# Patient Record
Sex: Female | Born: 1980
Health system: Southern US, Community
[De-identification: ages and names within clinical notes are randomized; demographics above are authoritative.]

## PROBLEM LIST (undated history)

## (undated) DIAGNOSIS — M542 Cervicalgia: Secondary | ICD-10-CM

## (undated) DIAGNOSIS — M791 Myalgia, unspecified site: Secondary | ICD-10-CM

## (undated) DIAGNOSIS — Z309 Encounter for contraceptive management, unspecified: Secondary | ICD-10-CM

## (undated) DIAGNOSIS — Z113 Encounter for screening for infections with a predominantly sexual mode of transmission: Secondary | ICD-10-CM

## (undated) DIAGNOSIS — E559 Vitamin D deficiency, unspecified: Secondary | ICD-10-CM

## (undated) DIAGNOSIS — R51 Headache: Secondary | ICD-10-CM

## (undated) DIAGNOSIS — F53 Postpartum depression: Secondary | ICD-10-CM

## (undated) DIAGNOSIS — E669 Obesity, unspecified: Secondary | ICD-10-CM

## (undated) DIAGNOSIS — M797 Fibromyalgia: Secondary | ICD-10-CM

## (undated) DIAGNOSIS — R768 Other specified abnormal immunological findings in serum: Secondary | ICD-10-CM

## (undated) DIAGNOSIS — M549 Dorsalgia, unspecified: Secondary | ICD-10-CM

## (undated) DIAGNOSIS — M069 Rheumatoid arthritis, unspecified: Secondary | ICD-10-CM

## (undated) DIAGNOSIS — O99345 Other mental disorders complicating the puerperium: Secondary | ICD-10-CM

## (undated) DIAGNOSIS — K589 Irritable bowel syndrome without diarrhea: Secondary | ICD-10-CM

## (undated) DIAGNOSIS — A599 Trichomoniasis, unspecified: Principal | ICD-10-CM

## (undated) DIAGNOSIS — K219 Gastro-esophageal reflux disease without esophagitis: Secondary | ICD-10-CM

## (undated) HISTORY — DX: Dorsalgia, unspecified: M54.9

## (undated) HISTORY — DX: Encounter for screening for infections with a predominantly sexual mode of transmission: Z11.3

## (undated) HISTORY — DX: Myalgia, unspecified site: M79.10

## (undated) HISTORY — DX: Postpartum depression: F53.0

## (undated) HISTORY — DX: Encounter for contraceptive management, unspecified: Z30.9

## (undated) HISTORY — DX: Fibromyalgia: M79.7

## (undated) HISTORY — DX: Obesity, unspecified: E66.9

## (undated) HISTORY — DX: Rheumatoid arthritis, unspecified: M06.9

## (undated) HISTORY — DX: Other specified abnormal immunological findings in serum: R76.8

## (undated) HISTORY — PX: DILATION AND CURETTAGE OF UTERUS: SHX78

## (undated) HISTORY — DX: Vitamin D deficiency, unspecified: E55.9

## (undated) HISTORY — DX: Cervicalgia: M54.2

## (undated) HISTORY — DX: Trichomoniasis, unspecified: A59.9

## (undated) HISTORY — DX: Other mental disorders complicating the puerperium: O99.345

---

## 2001-04-03 ENCOUNTER — Other Ambulatory Visit: Admission: RE | Admit: 2001-04-03 | Discharge: 2001-04-03 | Payer: Self-pay | Admitting: Obstetrics and Gynecology

## 2001-09-29 ENCOUNTER — Inpatient Hospital Stay (HOSPITAL_COMMUNITY): Admission: RE | Admit: 2001-09-29 | Discharge: 2001-10-02 | Payer: Self-pay | Admitting: Obstetrics and Gynecology

## 2003-09-10 ENCOUNTER — Ambulatory Visit (HOSPITAL_COMMUNITY): Admission: RE | Admit: 2003-09-10 | Discharge: 2003-09-10 | Payer: Self-pay | Admitting: Family Medicine

## 2003-10-19 ENCOUNTER — Ambulatory Visit (HOSPITAL_COMMUNITY): Admission: RE | Admit: 2003-10-19 | Discharge: 2003-10-19 | Payer: Self-pay | Admitting: Family Medicine

## 2003-11-22 ENCOUNTER — Ambulatory Visit (HOSPITAL_COMMUNITY): Admission: RE | Admit: 2003-11-22 | Discharge: 2003-11-22 | Payer: Self-pay | Admitting: Family Medicine

## 2003-12-01 ENCOUNTER — Encounter (HOSPITAL_COMMUNITY): Admission: RE | Admit: 2003-12-01 | Discharge: 2003-12-31 | Payer: Self-pay | Admitting: Family Medicine

## 2004-04-19 ENCOUNTER — Emergency Department (HOSPITAL_COMMUNITY): Admission: EM | Admit: 2004-04-19 | Discharge: 2004-04-19 | Payer: Self-pay | Admitting: Emergency Medicine

## 2004-04-30 ENCOUNTER — Emergency Department (HOSPITAL_COMMUNITY): Admission: EM | Admit: 2004-04-30 | Discharge: 2004-04-30 | Payer: Self-pay | Admitting: Emergency Medicine

## 2004-06-26 ENCOUNTER — Ambulatory Visit (HOSPITAL_COMMUNITY): Admission: RE | Admit: 2004-06-26 | Discharge: 2004-06-26 | Payer: Self-pay | Admitting: Family Medicine

## 2006-12-11 ENCOUNTER — Emergency Department (HOSPITAL_COMMUNITY): Admission: EM | Admit: 2006-12-11 | Discharge: 2006-12-11 | Payer: Self-pay | Admitting: Emergency Medicine

## 2006-12-26 ENCOUNTER — Encounter (HOSPITAL_COMMUNITY): Admission: RE | Admit: 2006-12-26 | Discharge: 2007-01-25 | Payer: Self-pay | Admitting: Family Medicine

## 2007-01-27 ENCOUNTER — Encounter (HOSPITAL_COMMUNITY): Admission: RE | Admit: 2007-01-27 | Discharge: 2007-02-19 | Payer: Self-pay | Admitting: Family Medicine

## 2007-02-21 ENCOUNTER — Encounter (HOSPITAL_COMMUNITY): Admission: RE | Admit: 2007-02-21 | Discharge: 2007-03-23 | Payer: Self-pay | Admitting: Family Medicine

## 2007-02-25 ENCOUNTER — Ambulatory Visit (HOSPITAL_COMMUNITY): Admission: RE | Admit: 2007-02-25 | Discharge: 2007-02-25 | Payer: Self-pay | Admitting: Family Medicine

## 2007-03-07 ENCOUNTER — Ambulatory Visit (HOSPITAL_COMMUNITY): Admission: RE | Admit: 2007-03-07 | Discharge: 2007-03-07 | Payer: Self-pay | Admitting: Family Medicine

## 2007-03-24 ENCOUNTER — Encounter (HOSPITAL_COMMUNITY): Admission: RE | Admit: 2007-03-24 | Discharge: 2007-04-23 | Payer: Self-pay | Admitting: Family Medicine

## 2007-11-26 ENCOUNTER — Other Ambulatory Visit: Admission: RE | Admit: 2007-11-26 | Discharge: 2007-11-26 | Payer: Self-pay | Admitting: Obstetrics and Gynecology

## 2008-11-26 ENCOUNTER — Other Ambulatory Visit: Admission: RE | Admit: 2008-11-26 | Discharge: 2008-11-26 | Payer: Self-pay | Admitting: Obstetrics and Gynecology

## 2009-12-06 ENCOUNTER — Other Ambulatory Visit: Admission: RE | Admit: 2009-12-06 | Discharge: 2009-12-06 | Payer: Self-pay | Admitting: Obstetrics and Gynecology

## 2010-07-07 NOTE — Op Note (Signed)
   NAME:  [name], [name] NO.:  [id number]   MEDICAL RECORD NO.:  [id number]                  PATIENT TYPE:   LOCATION:                                       FACILITY:   PHYSICIAN:  Tilda Burrow, M.D.              DATE OF BIRTH:   DATE OF PROCEDURE:  09/29/2001  DATE OF DISCHARGE:                                 OPERATIVE REPORT   PREOPERATIVE DIAGNOSIS:  Pregnancy [redacted] weeks gestation, repeat cesarean  section.   POSTOPERATIVE DIAGNOSIS:  Pregnancy [redacted] weeks gestation, repeat cesarean  section.   PROCEDURE:  Repeat low transverse cesarean section.   SURGEON:  Tilda Burrow, M.D.   ASSISTANT:  None.   ANESTHESIA:  Spinal.   COMPLICATIONS:  None.   ESTIMATED BLOOD LOSS:  Less than or equal to 400 cc.   FINDINGS:  A healthy female infant 7 pounds 9.7 ounces, a very thin lower  uterine segment.   INDICATIONS:  A 30 year old female scheduled for a repeat cesarean section  at [redacted] weeks gestation.   DETAILS OF PROCEDURE:  The patient was taken to the operating room and  prepped and draped in the usual fashion for lower abdominal surgery after  spinal anesthesia was introduced.  The old scar was trimmed and a  [name]-technique used for entry of the abdominal cavity with bladder  flap development on the very thinned out lower uterine segment.   A transverse nick was made in the lower uterine segment, extended laterally  using index finger traction and the fetal vertex rotated into the incision  and then delivered with fundal pressure and then manual guidance of the  vertex.  The patient had the cord clamped and cut after delivery of the  infant and the infant passed to the waiting physician.  The placenta was  then delivered after cord blood gases and samples obtained and documented  elsewhere.   The uterus was irrigated with antibiotic containing solution with a single  layer running locking closure of the uterus was performed using  #0 chromic  suture with good tissue approximation and hemostasis.  Bladder flap  reapproximation was with 2-0 chromic.  The pelvis was irrigated, inspected,  and confirmed as hemostatic.   Then, the anterior peritoneum closed using continuous running 2-0 chromic  followed by #0 Vicryl closure of the fascia and interrupted 2-0 plain  reapproximation of the subcu fatty tissue.  The patient tolerated the  procedure well and went to recovery room in excellent condition.                                               Tilda Burrow, M.D.    JVF/MEDQ  D:  12/07/2001  T:  12/07/2001  Job:  [id number]

## 2010-07-07 NOTE — Procedures (Signed)
[name], [name]              ACCOUNT NO.:  [id number]   MEDICAL RECORD NO.:  [id number]          PATIENT TYPE:  OUT   LOCATION:  RESP                          FACILITY:  [facility]   PHYSICIAN:  Edward L. Juanetta Gosling, M.D.DATE OF BIRTH:  1980-10-05   DATE OF PROCEDURE:  DATE OF DISCHARGE:  06/26/2004                              PULMONARY FUNCTION TEST   1.  Spirometry shows a moderate ventilatory defect with a fairly typical      restrictive shape of the flow volume curve.  2.  Lung volumes show reduction of total lung capacity of approximately the      same order of magnitude as the ventilatory defect previously noted.  3.  DLCO is mildly reduced.  4.  There is improvement with inhaled bronchodilator, but it does not reach      the level of significance.      ELH/MEDQ  D:  06/29/2004  T:  06/30/2004  Job:  [id number]
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
## 2010-07-07 NOTE — Discharge Summary (Signed)
   NAME:  [name], [name]                        ACCOUNT NO.:  [id number]   MEDICAL RECORD NO.:  [id number]                   PATIENT TYPE:  INP   LOCATION:  A420                                 FACILITY:  [facility]   PHYSICIAN:  Lazaro Arms, M.D.                DATE OF BIRTH:  1981-01-08   DATE OF ADMISSION:  09/29/2001  DATE OF DISCHARGE:  10/02/2001                                 DISCHARGE SUMMARY   DISCHARGE DIAGNOSES:  1. Status post a repeat low transverse cesarean section.  2. Unremarkable postoperative course.   PROCEDURE:  A repeat cesarean section.   Please refer to the transcribed history and physical and the operative note  for details of admission to the hospital.   HOSPITAL COURSE:  The patient was admitted postoperatively.  Intraoperative  course was unremarkable.  She was delivered of a healthy 7 pound 8 ounce  female infant.  She tolerated clear liquids and a regular diet, voided without  symptoms, ambulated without symptoms, had progression of bowel function with  flatus and bowel movement.  She tolerated transition from IV to oral pain  medicine.  Her incision was clean, dry, and intact.  Her hemoglobin and hematocrit on postoperative day #3 was 10.5 and 30.7.  She was discharged to home the morning of postoperative day #3 in good  stable condition.  Follow up in the office next Monday to have her staples  removed.  She was given Tylox and Motrin for pain and instructions and  precautions for return or contact our office prior to that time.                                               Lazaro Arms, M.D.    Loraine Maple  D:  10/02/2001  T:  10/06/2001  Job:  [id number]

## 2010-09-22 ENCOUNTER — Other Ambulatory Visit: Payer: Self-pay | Admitting: Obstetrics and Gynecology

## 2010-12-15 ENCOUNTER — Other Ambulatory Visit (HOSPITAL_COMMUNITY)
Admission: RE | Admit: 2010-12-15 | Discharge: 2010-12-15 | Disposition: A | Discharge status: Home / Self Care | Payer: BC Managed Care – PPO | Source: Ambulatory Visit | Attending: Obstetrics and Gynecology | Admitting: Obstetrics and Gynecology

## 2010-12-15 DIAGNOSIS — Z01419 Encounter for gynecological examination (general) (routine) without abnormal findings: Secondary | ICD-10-CM | POA: Insufficient documentation

## 2010-12-15 DIAGNOSIS — Z113 Encounter for screening for infections with a predominantly sexual mode of transmission: Secondary | ICD-10-CM | POA: Insufficient documentation

## 2011-07-12 LAB — OB RESULTS CONSOLE ANTIBODY SCREEN: Antibody Screen: NEGATIVE

## 2011-07-12 LAB — OB RESULTS CONSOLE RPR: RPR: NONREACTIVE

## 2011-07-12 LAB — OB RESULTS CONSOLE GC/CHLAMYDIA
Chlamydia: NEGATIVE
Gonorrhea: NEGATIVE

## 2011-07-12 LAB — OB RESULTS CONSOLE ABO/RH: RH Type: NEGATIVE

## 2011-07-12 LAB — OB RESULTS CONSOLE HIV ANTIBODY (ROUTINE TESTING): HIV: NONREACTIVE

## 2011-07-12 LAB — OB RESULTS CONSOLE HEPATITIS B SURFACE ANTIGEN: Hepatitis B Surface Ag: NEGATIVE

## 2011-07-12 LAB — OB RESULTS CONSOLE RUBELLA ANTIBODY, IGM: Rubella: IMMUNE

## 2011-10-30 ENCOUNTER — Emergency Department (HOSPITAL_COMMUNITY)
Admission: EM | Admit: 2011-10-30 | Discharge: 2011-10-30 | Disposition: A | Discharge status: Home / Self Care | Payer: BC Managed Care – PPO | Attending: Emergency Medicine | Admitting: Emergency Medicine

## 2011-10-30 ENCOUNTER — Emergency Department (HOSPITAL_COMMUNITY): Payer: BC Managed Care – PPO

## 2011-10-30 ENCOUNTER — Encounter (HOSPITAL_COMMUNITY): Payer: Self-pay | Admitting: *Deleted

## 2011-10-30 DIAGNOSIS — O99891 Other specified diseases and conditions complicating pregnancy: Secondary | ICD-10-CM | POA: Insufficient documentation

## 2011-10-30 DIAGNOSIS — M542 Cervicalgia: Secondary | ICD-10-CM

## 2011-10-30 DIAGNOSIS — M79609 Pain in unspecified limb: Secondary | ICD-10-CM | POA: Insufficient documentation

## 2011-10-30 DIAGNOSIS — M79671 Pain in right foot: Secondary | ICD-10-CM

## 2011-10-30 DIAGNOSIS — Z3490 Encounter for supervision of normal pregnancy, unspecified, unspecified trimester: Secondary | ICD-10-CM

## 2011-10-30 NOTE — Progress Notes (Signed)
Toco reapplied after return from radiology.  Doppler applied again and FHR 155-160.

## 2011-10-30 NOTE — Progress Notes (Signed)
Dr. Revonda Humphrey make aware of no further obstetrical needs.  Patient given instructions to return to [facility]U if sharp abdominal pain, vaginal bleeding or leaking. She will call [facility] tomorrow to discuss if follow up before 9/30 is needed.   Patient continues to deny any sharp abdominal pain, continues to have mild RLQ pressure. No vaginal bleeding or leaking noted.
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
## 2011-10-30 NOTE — Progress Notes (Signed)
OB RR RN at bedside. Pt in no apparent distress. Pt reports no abd pain, no contractions, no vaginal bleeding or leaking. Pt EDC 03/07/12 and is seen at [facility] in [place]. Pt reports no complications with pregnancy and has had 2 csections with previous children. Pt only complaint in neck stiffness.

## 2011-10-30 NOTE — ED Notes (Signed)
See trauma narrator 
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
## 2011-10-30 NOTE — ED Notes (Signed)
Pt to xray

## 2011-10-30 NOTE — Progress Notes (Signed)
Spoke with Dr. Marice Potter about patient and history given. From obstetrical standpoint patient can be discharged home once medically cleared.
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
## 2011-10-30 NOTE — Progress Notes (Signed)
Report given to Maxine Glenn for [facility] RR RN care
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
## 2012-02-22 ENCOUNTER — Encounter (HOSPITAL_COMMUNITY): Payer: Self-pay | Admitting: Pharmacist

## 2012-02-23 ENCOUNTER — Encounter (HOSPITAL_COMMUNITY): Payer: Self-pay | Admitting: Obstetrics and Gynecology
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
## 2012-02-25 ENCOUNTER — Encounter (HOSPITAL_COMMUNITY): Payer: Self-pay

## 2012-02-26 ENCOUNTER — Encounter (HOSPITAL_COMMUNITY)
Admission: RE | Admit: 2012-02-26 | Discharge: 2012-02-26 | Disposition: A | Discharge status: Home / Self Care | Payer: 59 | Source: Ambulatory Visit | Attending: Obstetrics and Gynecology | Admitting: Obstetrics and Gynecology

## 2012-02-26 ENCOUNTER — Encounter (HOSPITAL_COMMUNITY): Payer: Self-pay

## 2012-02-26 HISTORY — DX: Gastro-esophageal reflux disease without esophagitis: K21.9

## 2012-02-26 HISTORY — DX: Headache: R51

## 2012-02-26 LAB — CBC
HCT: 31.1 % — ABNORMAL LOW (ref 36.0–46.0)
Hemoglobin: 10.3 g/dL — ABNORMAL LOW (ref 12.0–15.0)
MCH: 28.9 pg (ref 26.0–34.0)
MCHC: 33.1 g/dL (ref 30.0–36.0)
MCV: 87.1 fL (ref 78.0–100.0)
Platelets: 218 10*3/uL (ref 150–400)
RBC: 3.57 MIL/uL — ABNORMAL LOW (ref 3.87–5.11)
RDW: 13.8 % (ref 11.5–15.5)
WBC: 6.7 10*3/uL (ref 4.0–10.5)

## 2012-02-26 LAB — TYPE AND SCREEN
ABO/RH(D): O NEG
Antibody Screen: POSITIVE
DAT, IgG: NEGATIVE

## 2012-02-26 LAB — SURGICAL PCR SCREEN
MRSA, PCR: NEGATIVE
Staphylococcus aureus: POSITIVE — AB

## 2012-02-26 LAB — RPR: RPR Ser Ql: NONREACTIVE

## 2012-02-26 NOTE — Patient Instructions (Addendum)
   Your procedure is scheduled XB:[id number] January 10th  Enter through the Main Entrance of [facility] at:11:30am Pick up the phone at the desk and dial [phone] and inform us of your arrival.  Please call this number if you have any problems the morning of surgery: [phone]  Remember: Do not eat food after midnight on Thursday You may have clear liquids until 9am on Friday then nothing   Do not wear jewelry, make-up, or FINGER nail polish No metal in your hair or on your body. Do not wear lotions, powders, perfumes. You may wear deodorant.  Please use your CHG wash as directed prior to surgery.  Do not shave anywhere for at least 12 hours prior to first CHG shower.  Do not bring valuables to the hospital.   Leave suitcase in the car. After Surgery it may be brought to your room. For patients being admitted to the hospital, checkout time is 11:00am the day of discharge.

## 2012-02-29 ENCOUNTER — Encounter (HOSPITAL_COMMUNITY): Payer: Self-pay | Admitting: *Deleted

## 2012-02-29 ENCOUNTER — Inpatient Hospital Stay (HOSPITAL_COMMUNITY)
Admission: RE | Admit: 2012-02-29 | Discharge: 2012-03-02 | DRG: 766 | Disposition: A | Discharge status: Home / Self Care | Payer: 59 | Source: Ambulatory Visit | Attending: Obstetrics and Gynecology | Admitting: Obstetrics and Gynecology

## 2012-02-29 ENCOUNTER — Encounter (HOSPITAL_COMMUNITY)
Admission: RE | Disposition: A | Discharge status: Home / Self Care | Payer: Self-pay | Source: Ambulatory Visit | Attending: Obstetrics and Gynecology

## 2012-02-29 ENCOUNTER — Encounter (HOSPITAL_COMMUNITY): Payer: Self-pay | Admitting: Anesthesiology

## 2012-02-29 ENCOUNTER — Inpatient Hospital Stay (HOSPITAL_COMMUNITY): Payer: 59 | Admitting: Anesthesiology

## 2012-02-29 DIAGNOSIS — Z01812 Encounter for preprocedural laboratory examination: Secondary | ICD-10-CM

## 2012-02-29 DIAGNOSIS — Z01818 Encounter for other preprocedural examination: Secondary | ICD-10-CM

## 2012-02-29 DIAGNOSIS — O34219 Maternal care for unspecified type scar from previous cesarean delivery: Secondary | ICD-10-CM

## 2012-02-29 HISTORY — DX: Irritable bowel syndrome, unspecified: K58.9

## 2012-02-29 LAB — PREPARE RBC (CROSSMATCH)

## 2012-02-29 SURGERY — Surgical Case
Anesthesia: Spinal | Site: Abdomen | Wound class: Clean Contaminated

## 2012-02-29 MED ORDER — OXYCODONE-ACETAMINOPHEN 5-325 MG PO TABS
1.0000 | ORAL_TABLET | ORAL | Status: DC | PRN
  Administered 2012-02-29 – 2012-03-02 (×6): 1 via ORAL
  Filled 2012-02-29 (×6): qty 1

## 2012-02-29 MED ORDER — ZOLPIDEM TARTRATE 5 MG PO TABS: 5.0000 mg | ORAL_TABLET | Freq: Every evening | ORAL | Status: DC | PRN

## 2012-02-29 MED ORDER — PROMETHAZINE HCL 25 MG/ML IJ SOLN: 6.2500 mg | INTRAMUSCULAR | Status: DC | PRN

## 2012-02-29 MED ORDER — NALBUPHINE SYRINGE 5 MG/0.5 ML
INJECTION | INTRAMUSCULAR | Status: AC
  Administered 2012-02-29: 10 mg
  Filled 2012-02-29: qty 1

## 2012-02-29 MED ORDER — SCOPOLAMINE 1 MG/3DAYS TD PT72
MEDICATED_PATCH | TRANSDERMAL | Status: AC
  Filled 2012-02-29: qty 1

## 2012-02-29 MED ORDER — FENTANYL CITRATE 0.05 MG/ML IJ SOLN
25.0000 ug | INTRAMUSCULAR | Status: DC | PRN
  Administered 2012-02-29: 50 ug via INTRAVENOUS

## 2012-02-29 MED ORDER — IBUPROFEN 600 MG PO TABS
600.0000 mg | ORAL_TABLET | Freq: Four times a day (QID) | ORAL | Status: DC
  Administered 2012-02-29 – 2012-03-02 (×7): 600 mg via ORAL
  Filled 2012-02-29 (×7): qty 1

## 2012-02-29 MED ORDER — CEFAZOLIN SODIUM-DEXTROSE 2-3 GM-% IV SOLR
INTRAVENOUS | Status: AC
  Filled 2012-02-29: qty 50

## 2012-02-29 MED ORDER — MENTHOL 3 MG MT LOZG: 1.0000 | LOZENGE | OROMUCOSAL | Status: DC | PRN

## 2012-02-29 MED ORDER — SENNOSIDES-DOCUSATE SODIUM 8.6-50 MG PO TABS
2.0000 | ORAL_TABLET | Freq: Every day | ORAL | Status: DC
  Administered 2012-02-29: 2 via ORAL

## 2012-02-29 MED ORDER — BUPIVACAINE IN DEXTROSE 0.75-8.25 % IT SOLN
INTRATHECAL | Status: DC | PRN
  Administered 2012-02-29: 12 mg via INTRATHECAL

## 2012-02-29 MED ORDER — METOCLOPRAMIDE HCL 5 MG/ML IJ SOLN: 10.0000 mg | Freq: Three times a day (TID) | INTRAMUSCULAR | Status: DC | PRN

## 2012-02-29 MED ORDER — SIMETHICONE 80 MG PO CHEW
80.0000 mg | CHEWABLE_TABLET | Freq: Three times a day (TID) | ORAL | Status: DC
  Administered 2012-02-29 – 2012-03-02 (×7): 80 mg via ORAL

## 2012-02-29 MED ORDER — LACTATED RINGERS IV SOLN
INTRAVENOUS | Status: DC
  Administered 2012-02-29 – 2012-03-01 (×2): via INTRAVENOUS

## 2012-02-29 MED ORDER — OXYTOCIN 10 UNIT/ML IJ SOLN
INTRAMUSCULAR | Status: AC
  Filled 2012-02-29: qty 4

## 2012-02-29 MED ORDER — ONDANSETRON HCL 4 MG/2ML IJ SOLN
INTRAMUSCULAR | Status: AC
  Filled 2012-02-29: qty 2

## 2012-02-29 MED ORDER — MEPERIDINE HCL 25 MG/ML IJ SOLN: 6.2500 mg | INTRAMUSCULAR | Status: DC | PRN

## 2012-02-29 MED ORDER — KETOROLAC TROMETHAMINE 30 MG/ML IJ SOLN: 30.0000 mg | Freq: Four times a day (QID) | INTRAMUSCULAR | Status: DC | PRN

## 2012-02-29 MED ORDER — PRENATAL MULTIVITAMIN CH
1.0000 | ORAL_TABLET | Freq: Every day | ORAL | Status: DC
  Administered 2012-03-01 – 2012-03-02 (×2): 1 via ORAL
  Filled 2012-02-29 (×2): qty 1

## 2012-02-29 MED ORDER — SCOPOLAMINE 1 MG/3DAYS TD PT72
1.0000 | MEDICATED_PATCH | Freq: Once | TRANSDERMAL | Status: DC
  Administered 2012-02-29: 1.5 mg via TRANSDERMAL

## 2012-02-29 MED ORDER — FENTANYL CITRATE 0.05 MG/ML IJ SOLN
INTRAMUSCULAR | Status: AC
  Filled 2012-02-29: qty 2

## 2012-02-29 MED ORDER — ONDANSETRON HCL 4 MG/2ML IJ SOLN: 4.0000 mg | Freq: Three times a day (TID) | INTRAMUSCULAR | Status: DC | PRN

## 2012-02-29 MED ORDER — ONDANSETRON HCL 4 MG PO TABS: 4.0000 mg | ORAL_TABLET | ORAL | Status: DC | PRN

## 2012-02-29 MED ORDER — ONDANSETRON HCL 4 MG/2ML IJ SOLN: 4.0000 mg | INTRAMUSCULAR | Status: DC | PRN

## 2012-02-29 MED ORDER — DIPHENHYDRAMINE HCL 25 MG PO CAPS: 25.0000 mg | ORAL_CAPSULE | Freq: Four times a day (QID) | ORAL | Status: DC | PRN

## 2012-02-29 MED ORDER — LACTATED RINGERS IV SOLN
INTRAVENOUS | Status: DC | PRN
  Administered 2012-02-29 (×3): via INTRAVENOUS

## 2012-02-29 MED ORDER — SCOPOLAMINE 1 MG/3DAYS TD PT72: 1.0000 | MEDICATED_PATCH | Freq: Once | TRANSDERMAL | Status: DC

## 2012-02-29 MED ORDER — FENTANYL CITRATE 0.05 MG/ML IJ SOLN
INTRAMUSCULAR | Status: AC
  Administered 2012-02-29: 50 ug via INTRAVENOUS
  Filled 2012-02-29: qty 2

## 2012-02-29 MED ORDER — SODIUM CHLORIDE 0.9 % IJ SOLN: 3.0000 mL | INTRAMUSCULAR | Status: DC | PRN

## 2012-02-29 MED ORDER — ONDANSETRON HCL 4 MG/2ML IJ SOLN
INTRAMUSCULAR | Status: DC | PRN
  Administered 2012-02-29: 4 mg via INTRAVENOUS

## 2012-02-29 MED ORDER — MIDAZOLAM HCL 2 MG/2ML IJ SOLN: 0.5000 mg | Freq: Once | INTRAMUSCULAR | Status: DC | PRN

## 2012-02-29 MED ORDER — PHENYLEPHRINE 40 MCG/ML (10ML) SYRINGE FOR IV PUSH (FOR BLOOD PRESSURE SUPPORT)
PREFILLED_SYRINGE | INTRAVENOUS | Status: AC
  Filled 2012-02-29: qty 10

## 2012-02-29 MED ORDER — PHENYLEPHRINE 40 MCG/ML (10ML) SYRINGE FOR IV PUSH (FOR BLOOD PRESSURE SUPPORT)
PREFILLED_SYRINGE | INTRAVENOUS | Status: AC
Start: 2012-02-29 — End: 2012-02-29
  Filled 2012-02-29: qty 5

## 2012-02-29 MED ORDER — EPHEDRINE SULFATE 50 MG/ML IJ SOLN
INTRAMUSCULAR | Status: DC | PRN
  Administered 2012-02-29 (×4): 5 mg via INTRAVENOUS

## 2012-02-29 MED ORDER — MORPHINE SULFATE (PF) 0.5 MG/ML IJ SOLN
INTRAMUSCULAR | Status: DC | PRN
  Administered 2012-02-29: .1 mg via INTRATHECAL

## 2012-02-29 MED ORDER — NALOXONE HCL 0.4 MG/ML IJ SOLN: 0.4000 mg | INTRAMUSCULAR | Status: DC | PRN

## 2012-02-29 MED ORDER — NALBUPHINE HCL 10 MG/ML IJ SOLN: 5.0000 mg | INTRAMUSCULAR | Status: DC | PRN

## 2012-02-29 MED ORDER — OXYTOCIN 40 UNITS IN LACTATED RINGERS INFUSION - SIMPLE MED: 62.5000 mL/h | INTRAVENOUS | Status: DC

## 2012-02-29 MED ORDER — LACTATED RINGERS IV SOLN
Freq: Once | INTRAVENOUS | Status: AC
  Administered 2012-02-29: 1000 mL/h via INTRAVENOUS

## 2012-02-29 MED ORDER — OXYCODONE-ACETAMINOPHEN 5-325 MG PO TABS: 1.0000 | ORAL_TABLET | ORAL | Status: DC | PRN

## 2012-02-29 MED ORDER — DIPHENHYDRAMINE HCL 50 MG/ML IJ SOLN: 25.0000 mg | INTRAMUSCULAR | Status: DC | PRN

## 2012-02-29 MED ORDER — CEFAZOLIN SODIUM-DEXTROSE 2-3 GM-% IV SOLR
2.0000 g | Freq: Once | INTRAVENOUS | Status: AC
  Administered 2012-02-29: 2 g via INTRAVENOUS

## 2012-02-29 MED ORDER — NALOXONE HCL 1 MG/ML IJ SOLN: 1.0000 ug/kg/h | INTRAMUSCULAR | Status: DC | PRN

## 2012-02-29 MED ORDER — KETOROLAC TROMETHAMINE 30 MG/ML IJ SOLN
INTRAMUSCULAR | Status: AC
  Administered 2012-02-29: 30 mg via INTRAVENOUS
  Filled 2012-02-29: qty 1

## 2012-02-29 MED ORDER — FENTANYL CITRATE 0.05 MG/ML IJ SOLN
INTRAMUSCULAR | Status: DC | PRN
  Administered 2012-02-29: 25 ug via INTRATHECAL

## 2012-02-29 MED ORDER — DIBUCAINE 1 % RE OINT: 1.0000 "application " | TOPICAL_OINTMENT | RECTAL | Status: DC | PRN

## 2012-02-29 MED ORDER — TETANUS-DIPHTH-ACELL PERTUSSIS 5-2.5-18.5 LF-MCG/0.5 IM SUSP: 0.5000 mL | Freq: Once | INTRAMUSCULAR | Status: DC

## 2012-02-29 MED ORDER — WITCH HAZEL-GLYCERIN EX PADS: 1.0000 "application " | MEDICATED_PAD | CUTANEOUS | Status: DC | PRN

## 2012-02-29 MED ORDER — LANOLIN HYDROUS EX OINT: 1.0000 "application " | TOPICAL_OINTMENT | CUTANEOUS | Status: DC | PRN

## 2012-02-29 MED ORDER — DIPHENHYDRAMINE HCL 50 MG/ML IJ SOLN: 12.5000 mg | INTRAMUSCULAR | Status: DC | PRN

## 2012-02-29 MED ORDER — EPHEDRINE 5 MG/ML INJ
INTRAVENOUS | Status: AC
  Filled 2012-02-29: qty 10

## 2012-02-29 MED ORDER — MORPHINE SULFATE 0.5 MG/ML IJ SOLN
INTRAMUSCULAR | Status: AC
  Filled 2012-02-29: qty 10

## 2012-02-29 MED ORDER — PHENYLEPHRINE HCL 10 MG/ML IJ SOLN
INTRAMUSCULAR | Status: DC | PRN
  Administered 2012-02-29: 80 ug via INTRAVENOUS
  Administered 2012-02-29 (×2): 40 ug via INTRAVENOUS
  Administered 2012-02-29 (×3): 80 ug via INTRAVENOUS
  Administered 2012-02-29: 40 ug via INTRAVENOUS
  Administered 2012-02-29 (×2): 80 ug via INTRAVENOUS

## 2012-02-29 MED ORDER — KETOROLAC TROMETHAMINE 30 MG/ML IJ SOLN
30.0000 mg | Freq: Four times a day (QID) | INTRAMUSCULAR | Status: DC | PRN
  Administered 2012-02-29: 30 mg via INTRAVENOUS

## 2012-02-29 MED ORDER — DIPHENHYDRAMINE HCL 25 MG PO CAPS: 25.0000 mg | ORAL_CAPSULE | ORAL | Status: DC | PRN

## 2012-02-29 MED ORDER — ACETAMINOPHEN 10 MG/ML IV SOLN: 1000.0000 mg | Freq: Four times a day (QID) | INTRAVENOUS | Status: AC | PRN

## 2012-02-29 MED ORDER — OXYTOCIN 10 UNIT/ML IJ SOLN
40.0000 [IU] | INTRAVENOUS | Status: DC | PRN
  Administered 2012-02-29: 40 [IU] via INTRAVENOUS

## 2012-02-29 MED ORDER — SIMETHICONE 80 MG PO CHEW: 80.0000 mg | CHEWABLE_TABLET | ORAL | Status: DC | PRN

## 2012-02-29 SURGICAL SUPPLY — 36 items
APL SKNCLS STERI-STRIP NONHPOA (GAUZE/BANDAGES/DRESSINGS) ×1
BENZOIN TINCTURE PRP APPL 2/3 (GAUZE/BANDAGES/DRESSINGS) ×1 IMPLANT
CLOTH BEACON ORANGE TIMEOUT ST (SAFETY) ×2 IMPLANT
DRAPE LG THREE QUARTER DISP (DRAPES) ×2 IMPLANT
DRSG OPSITE POSTOP 4X10 (GAUZE/BANDAGES/DRESSINGS) ×2 IMPLANT
DURAPREP 26ML APPLICATOR (WOUND CARE) ×2 IMPLANT
ELECT REM PT RETURN 9FT ADLT (ELECTROSURGICAL) ×2
ELECTRODE REM PT RTRN 9FT ADLT (ELECTROSURGICAL) ×1 IMPLANT
EXTRACTOR VACUUM KIWI (MISCELLANEOUS) IMPLANT
GLOVE BIO SURGEON ST LM GN SZ9 (GLOVE) ×2 IMPLANT
GLOVE BIOGEL PI IND STRL 9 (GLOVE) ×2 IMPLANT
GLOVE BIOGEL PI INDICATOR 9 (GLOVE) ×2
GOWN PREVENTION PLUS LG XLONG (DISPOSABLE) ×2 IMPLANT
GOWN PREVENTION PLUS XLARGE (GOWN DISPOSABLE) ×2 IMPLANT
GOWN STRL REIN 3XL LVL4 (GOWN DISPOSABLE) ×2 IMPLANT
NDL HYPO 25X5/8 SAFETYGLIDE (NEEDLE) IMPLANT
NEEDLE HYPO 25X5/8 SAFETYGLIDE (NEEDLE) IMPLANT
NS IRRIG 1000ML POUR BTL (IV SOLUTION) ×2 IMPLANT
PACK C SECTION WH (CUSTOM PROCEDURE TRAY) ×2 IMPLANT
PAD OB MATERNITY 4.3X12.25 (PERSONAL CARE ITEMS) ×2 IMPLANT
RETRACTOR WND ALEXIS 25 LRG (MISCELLANEOUS) IMPLANT
RTRCTR C-SECT PINK 25CM LRG (MISCELLANEOUS) IMPLANT
RTRCTR WOUND ALEXIS 25CM LRG (MISCELLANEOUS)
SLEEVE SCD COMPRESS KNEE MED (MISCELLANEOUS) IMPLANT
STRIP CLOSURE SKIN 1/2X4 (GAUZE/BANDAGES/DRESSINGS) IMPLANT
STRIP CLOSURE SKIN 1/4X4 (GAUZE/BANDAGES/DRESSINGS) ×1 IMPLANT
SUT CHROMIC 0 CTX 36 (SUTURE) ×4 IMPLANT
SUT VIC AB 0 CT1 27 (SUTURE) ×2
SUT VIC AB 0 CT1 27XBRD ANBCTR (SUTURE) ×1 IMPLANT
SUT VIC AB 2-0 CT1 27 (SUTURE) ×4
SUT VIC AB 2-0 CT1 TAPERPNT 27 (SUTURE) ×2 IMPLANT
SUT VIC AB 4-0 KS 27 (SUTURE) ×2 IMPLANT
SYR BULB IRRIGATION 50ML (SYRINGE) IMPLANT
TOWEL OR 17X24 6PK STRL BLUE (TOWEL DISPOSABLE) ×2 IMPLANT
TRAY FOLEY CATH 14FR (SET/KITS/TRAYS/PACK) ×2 IMPLANT
WATER STERILE IRR 1000ML POUR (IV SOLUTION) ×1 IMPLANT

## 2012-02-29 NOTE — Anesthesia Postprocedure Evaluation (Signed)
Anesthesia Post Note  Patient: [name]  Procedure(s) Performed: Procedure(s) (LRB): CESAREAN SECTION (N/A)  Anesthesia type: Spinal  Patient location: PACU  Post pain: Pain level controlled  Post assessment: Post-op Vital signs reviewed  Last Vitals:  Filed Vitals:   02/29/12 1545  BP: 93/47  Pulse: 81  Temp:   Resp: 20    Post vital signs: Reviewed  Level of consciousness: awake  Complications: No apparent anesthesia complications

## 2012-02-29 NOTE — Anesthesia Preprocedure Evaluation (Signed)
Anesthesia Evaluation  Patient identified by MRN, date of birth, ID band Patient awake    Reviewed: Allergy & Precautions, H&P , NPO status , Patient's Chart, lab work & pertinent test results  Airway Mallampati: II      Dental No notable dental hx.    Pulmonary neg pulmonary ROS,  breath sounds clear to auscultation  Pulmonary exam normal       Cardiovascular Exercise Tolerance: Good negative cardio ROS  Rhythm:regular Rate:Normal     Neuro/Psych  Headaches, negative neurological ROS  negative psych ROS   GI/Hepatic negative GI ROS, Neg liver ROS, GERD-  ,  Endo/Other  negative endocrine ROSMorbid obesity  Renal/GU negative Renal ROS  negative genitourinary   Musculoskeletal   Abdominal Normal abdominal exam  (+)   Peds  Hematology negative hematology ROS (+)   Anesthesia Other Findings  IBS (irritable bowel syndrome)     GERD (gastroesophageal reflux disease)   with pregnancy    Headache              Reproductive/Obstetrics (+) Pregnancy                           Anesthesia Physical Anesthesia Plan  ASA: III  Anesthesia Plan: Spinal   Post-op Pain Management:    Induction:   Airway Management Planned:   Additional Equipment:   Intra-op Plan:   Post-operative Plan:   Informed Consent: I have reviewed the patients History and Physical, chart, labs and discussed the procedure including the risks, benefits and alternatives for the proposed anesthesia with the patient or authorized representative who has indicated his/her understanding and acceptance.     Plan Discussed with: Anesthesiologist, CRNA and Surgeon  Anesthesia Plan Comments:         Anesthesia Quick Evaluation

## 2012-02-29 NOTE — Brief Op Note (Signed)
02/29/2012  3:20 PM  PATIENT:  [name]  32 y.o. female  PRE-OPERATIVE DIAGNOSIS:  previous c/s x 2 , not for trial of labor Pregnancy 39 weeks   POST-OPERATIVE DIAGNOSIS:  Previous Cesarean Section  PROCEDURE:  Procedure(s) (LRB) with comments: CESAREAN SECTION (N/A) repeat Low Transverse  SURGEON:  Surgeon(s) and Role:    * Tilda Burrow, MD - Primary  PHYSICIAN ASSISTANT: Thad Ranger, MD   ASSISTANTS: none   ANESTHESIA:   spinal  EBL:  Total I/O In: 2600 [I.V.:2600] Out: 600 [Urine:100; Blood:500]  BLOOD ADMINISTERED:none  DRAINS: none   LOCAL MEDICATIONS USED:  NONE  SPECIMEN:  Source of Specimen:  placenta, to L  DISPOSITION OF SPECIMEN:  Labor and Delivery  COUNTS:  YES  TOURNIQUET:  * No tourniquets in log *  DICTATION: .Dragon Dictation Patient was taken to the operating room prepped and draped for lower surgery timeout conducted and procedure confirmed by surgical team. [name] 2 g administered intravenously. Abdomen was taped up so that the [name] incision site could be accessed. Incision was repeated without removal of the old scar. The fascia was easily dissected off the rectus muscles and peritoneal cavity entered in the midline easily. There was minimal adhesions present 2 tiny omental adhesions to the old bladder flap incision. Transverse uterine incision was made the fetal vertex rotated into the incision from its occiput posterior presentation and delivered easily with cord clamp placed and the female infant passed to the waiting pediatrician Dr. [name]. Placenta delivered easily from its posterior fundal location and membrane remnants were removed from the lower uterine segment. 2 layer closure of the uterus was performed with 0 chromic suture, the first layer running locking the second layer continuous running. Abdomen was irrigated, anterior peritoneum closed with running 2-0 chromic, the fascia closed with running 0 Vicryl beginning at each end  and sewing to the middle, then subcutaneous skin was then mobilized to allow for good tissue approximation and 3 interrupted horizontal mattress sutures placed to allow for tissue reapproximation then subcuticular 4-0 Vicryl closure of skin with a [name] needle performed. Sponge and needle counts correct EBL 500cc. PLAN OF CARE: Admit to inpatient   PATIENT DISPOSITION:  PACU - hemodynamically stable.   Delay start of Pharmacological VTE agent (>24hrs) due to surgical blood loss or risk of bleeding: yes

## 2012-02-29 NOTE — Interval H&P Note (Signed)
History and Physical Interval Note:  02/29/2012 1:20 PM  [name]  has presented today for surgery, with the diagnosis of previous c/s  The various methods of treatment have been discussed with the patient and family. After consideration of risks, benefits and other options for treatment, the patient has consented to  Procedure(s) (LRB) with comments: CESAREAN SECTION (N/A) as a surgical intervention .  The patient's history has been reviewed, patient examined, no change in status, stable for surgery.  I have reviewed the patient's chart and labs.  Questions were answered to the patient's satisfaction.     Tilda Burrow

## 2012-02-29 NOTE — Op Note (Signed)
See operative note details included in the brief operative note 

## 2012-02-29 NOTE — Transfer of Care (Signed)
Immediate Anesthesia Transfer of Care Note  Patient: [name]  Procedure(s) Performed: Procedure(s) (LRB) with comments: CESAREAN SECTION (N/A)  Patient Location: PACU  Anesthesia Type:General  Level of Consciousness: awake  Airway & Oxygen Therapy: Patient Spontanous Breathing  Post-op Assessment: Report given to PACU RN  Post vital signs: stable  Filed Vitals:   02/29/12 1150  BP: 102/54  Pulse: 104  Temp: 37 C  Resp: 20    Complications: No apparent anesthesia complications

## 2012-03-01 LAB — CBC
HCT: 25.2 % — ABNORMAL LOW (ref 36.0–46.0)
Hemoglobin: 8.4 g/dL — ABNORMAL LOW (ref 12.0–15.0)
MCH: 29.3 pg (ref 26.0–34.0)
MCHC: 33.3 g/dL (ref 30.0–36.0)
MCV: 87.8 fL (ref 78.0–100.0)
Platelets: 195 10*3/uL (ref 150–400)
RBC: 2.87 MIL/uL — ABNORMAL LOW (ref 3.87–5.11)
RDW: 14.1 % (ref 11.5–15.5)
WBC: 9 10*3/uL (ref 4.0–10.5)

## 2012-03-01 MED ORDER — LACTATED RINGERS IV BOLUS (SEPSIS)
500.0000 mL | Freq: Once | INTRAVENOUS | Status: AC
  Administered 2012-03-01: 500 mL via INTRAVENOUS

## 2012-03-01 NOTE — Progress Notes (Signed)
Subjective: Postpartum Day 1: Cesarean Delivery Patient reports incisional pain and tolerating PO.  No complaints a bit of itching  Objective: Vital signs in last 24 hours: Temp:  [97.3 F (36.3 C)-98.6 F (37 C)] 98 F (36.7 C) (01/11 0606) Pulse Rate:  [66-104] 83  (01/11 0606) Resp:  [16-20] 20  (01/11 0606) BP: (86-116)/(46-82) 111/82 mmHg (01/11 0606) SpO2:  [95 %-100 %] 96 % (01/11 0606) Weight:  [104.327 kg (230 lb)] 104.327 kg (230 lb) (01/10 1722)  Physical Exam:  General: alert, cooperative and no distress Lochia: appropriate Uterine Fundus: firm Incision: dressing dry DVT Evaluation: No evidence of DVT seen on physical exam.   Basename 03/01/12 0555  HGB 8.4*  HCT 25.2*    Assessment/Plan: Status post Cesarean section. Doing well postoperatively.  Continue current care.  Derry Kassel H 03/01/2012, 7:10 AM

## 2012-03-01 NOTE — Addendum Note (Signed)
Addendum  created 03/01/12 0952 by Yaviel Kloster S Maiana Hennigan, CRNA   Modules edited:Notes Section    

## 2012-03-01 NOTE — Anesthesia Postprocedure Evaluation (Signed)
  Anesthesia Post-op Note   Anesthesia Post-op Note  Patient: [name]  Procedure(s) Performed: Procedure(s) (LRB) with comments: CESAREAN SECTION (N/A)  Patient Location: PACU and Mother/Baby  Anesthesia Type:Spinal  Level of Consciousness: awake, alert  and oriented  Airway and Oxygen Therapy: Patient Spontanous Breathing  Post-op Pain: mild  Post-op Assessment: Patient's Cardiovascular Status Stable, Respiratory Function Stable, No signs of Nausea or vomiting and Pain level controlled  Post-op Vital Signs: stable  Complications: No apparent anesthesia complications

## 2012-03-02 LAB — HEMOGLOBIN AND HEMATOCRIT, BLOOD
HCT: 26.9 % — ABNORMAL LOW (ref 36.0–46.0)
Hemoglobin: 9 g/dL — ABNORMAL LOW (ref 12.0–15.0)

## 2012-03-02 LAB — BIRTH TISSUE RECOVERY COLLECTION (PLACENTA DONATION)

## 2012-03-02 MED ORDER — FERROUS SULFATE 325 (65 FE) MG PO TABS: 325.0000 mg | ORAL_TABLET | Freq: Two times a day (BID) | ORAL | Status: DC

## 2012-03-02 MED ORDER — PRENATAL MULTIVITAMIN CH: 1.0000 | ORAL_TABLET | Freq: Every day | ORAL | Status: DC

## 2012-03-02 MED ORDER — OXYCODONE-ACETAMINOPHEN 5-325 MG PO TABS: 1.0000 | ORAL_TABLET | Freq: Four times a day (QID) | ORAL | Status: DC | PRN

## 2012-03-02 MED ORDER — IBUPROFEN 600 MG PO TABS: 600.0000 mg | ORAL_TABLET | Freq: Four times a day (QID) | ORAL | Status: DC | PRN

## 2012-03-02 NOTE — Discharge Summary (Signed)
Obstetric Discharge Summary Reason for Admission: cesarean section, scheduled repeat Prenatal Procedures: none Intrapartum Procedures: cesarean: low cervical, transverse Postpartum Procedures: none Complications-Operative and Postpartum: none Eating, drinking, voiding, ambulating well.  +flatus.  Lochia and pain wnl.  No complaints.  Desires early d/c  Hemoglobin  Date Value Range Status  03/02/2012 9.0* 12.0 - 15.0 g/dL Final     HCT  Date Value Range Status  03/02/2012 26.9* 36.0 - 46.0 % Final    Physical Exam:  General: alert, cooperative and no distress Lochia: appropriate Uterine Fundus: firm Incision: healing well, no significant drainage, no dehiscence, no significant erythema. Honeycomb dressing intact. DVT Evaluation: No evidence of DVT seen on physical exam. Negative Homan's sign. No cords or calf tenderness. 2+BLE edema  Discharge Diagnoses: Term Pregnancy-delivered and RLTCS  Discharge Information: Date: 03/02/2012 Activity: pelvic rest Diet: routine Medications: PNV, Ibuprofen and Percocet Condition: stable Instructions: refer to practice specific booklet Discharge to: home Follow-up Information    Follow up with [facility]. On 03/05/2012. (as scheduled)    Contact information:   [address] [place] [address] [phone]         Newborn Data: Live born female  Birth Weight: 7 lb 7.9 oz (3400 g) APGAR: 8, 9  Home with mother.  Breast and bottlefeeding.  Desires [facility] for contraception.  OP circumcision at [facility].  Marge Duncans 03/02/2012, 6:44 AM

## 2012-03-03 ENCOUNTER — Encounter (HOSPITAL_COMMUNITY): Payer: Self-pay | Admitting: Obstetrics and Gynecology

## 2012-03-03 LAB — TYPE AND SCREEN
ABO/RH(D): O NEG
Antibody Screen: POSITIVE
DAT, IgG: NEGATIVE
Unit division: 0
Unit division: 0

## 2012-03-03 NOTE — Discharge Summary (Signed)
Attestation of Attending Supervision of Advanced Practitioner (PA/CNM/NP): Evaluation and management procedures were performed by the Advanced Practitioner under my supervision and collaboration.  I have reviewed the Advanced Practitioner's note and chart, and I agree with the management and plan.  Camree Wigington, MD, FACOG Attending Obstetrician & Gynecologist Faculty Practice, [facility]  

## 2012-03-05 NOTE — Progress Notes (Signed)
Ur chart review completed per request.  

## 2013-05-01 ENCOUNTER — Other Ambulatory Visit: Payer: Self-pay | Admitting: Obstetrics and Gynecology

## 2013-09-29 ENCOUNTER — Other Ambulatory Visit (HOSPITAL_COMMUNITY)
Admission: RE | Admit: 2013-09-29 | Discharge: 2013-09-29 | Disposition: A | Discharge status: Home / Self Care | Payer: 59 | Source: Ambulatory Visit | Attending: Adult Health | Admitting: Adult Health

## 2013-09-29 ENCOUNTER — Ambulatory Visit (INDEPENDENT_AMBULATORY_CARE_PROVIDER_SITE_OTHER): Payer: 59 | Admitting: Adult Health

## 2013-09-29 ENCOUNTER — Encounter: Payer: Self-pay | Admitting: Adult Health

## 2013-09-29 VITALS — BP 96/60 | HR 74 | Ht 61.5 in | Wt 210.0 lb

## 2013-09-29 DIAGNOSIS — Z1151 Encounter for screening for human papillomavirus (HPV): Secondary | ICD-10-CM | POA: Insufficient documentation

## 2013-09-29 DIAGNOSIS — E669 Obesity, unspecified: Secondary | ICD-10-CM

## 2013-09-29 DIAGNOSIS — F53 Postpartum depression: Secondary | ICD-10-CM

## 2013-09-29 DIAGNOSIS — Z113 Encounter for screening for infections with a predominantly sexual mode of transmission: Secondary | ICD-10-CM | POA: Diagnosis present

## 2013-09-29 DIAGNOSIS — Z309 Encounter for contraceptive management, unspecified: Secondary | ICD-10-CM

## 2013-09-29 DIAGNOSIS — O99345 Other mental disorders complicating the puerperium: Secondary | ICD-10-CM

## 2013-09-29 DIAGNOSIS — Z01419 Encounter for gynecological examination (general) (routine) without abnormal findings: Secondary | ICD-10-CM

## 2013-09-29 DIAGNOSIS — Z3041 Encounter for surveillance of contraceptive pills: Secondary | ICD-10-CM

## 2013-09-29 DIAGNOSIS — Z3049 Encounter for surveillance of other contraceptives: Secondary | ICD-10-CM

## 2013-09-29 HISTORY — DX: Obesity, unspecified: E66.9

## 2013-09-29 HISTORY — DX: Postpartum depression: F53.0

## 2013-09-29 HISTORY — DX: Other mental disorders complicating the puerperium: O99.345

## 2013-09-29 HISTORY — DX: Encounter for contraceptive management, unspecified: Z30.9

## 2013-09-29 LAB — LIPID PANEL
Cholesterol: 135 mg/dL (ref 0–200)
HDL: 46 mg/dL (ref >39)
LDL Cholesterol: 62 mg/dL (ref 0–99)
Total CHOL/HDL Ratio: 2.9 Ratio
Triglycerides: 133 mg/dL (ref <150)
VLDL: 27 mg/dL (ref 0–40)

## 2013-09-29 LAB — CBC
HCT: 38.4 % (ref 36.0–46.0)
Hemoglobin: 12.8 g/dL (ref 12.0–15.0)
MCH: 29.3 pg (ref 26.0–34.0)
MCHC: 33.3 g/dL (ref 30.0–36.0)
MCV: 87.9 fL (ref 78.0–100.0)
Platelets: 305 10*3/uL (ref 150–400)
RBC: 4.37 MIL/uL (ref 3.87–5.11)
RDW: 13.4 % (ref 11.5–15.5)
WBC: 6.6 10*3/uL (ref 4.0–10.5)

## 2013-09-29 LAB — COMPREHENSIVE METABOLIC PANEL
ALT: 14 U/L (ref 0–35)
AST: 12 U/L (ref 0–37)
Albumin: 3.8 g/dL (ref 3.5–5.2)
Alkaline Phosphatase: 97 U/L (ref 39–117)
BUN: 12 mg/dL (ref 6–23)
CO2: 24 mEq/L (ref 19–32)
Calcium: 8.6 mg/dL (ref 8.4–10.5)
Chloride: 100 mEq/L (ref 96–112)
Creat: 0.95 mg/dL (ref 0.50–1.10)
Glucose, Bld: 83 mg/dL (ref 70–99)
Potassium: 4.1 mEq/L (ref 3.5–5.3)
Sodium: 133 mEq/L — ABNORMAL LOW (ref 135–145)
Total Bilirubin: 0.3 mg/dL (ref 0.2–1.2)
Total Protein: 6.8 g/dL (ref 6.0–8.3)

## 2013-09-29 LAB — TSH: TSH: 1.518 u[IU]/mL (ref 0.350–4.500)

## 2013-09-29 MED ORDER — PHENTERMINE HCL 37.5 MG PO CAPS: 37.5000 mg | ORAL_CAPSULE | ORAL | Status: DC

## 2013-09-29 MED ORDER — LORAZEPAM 0.5 MG PO TABS: 0.5000 mg | ORAL_TABLET | Freq: Three times a day (TID) | ORAL | Status: DC

## 2013-09-29 NOTE — Patient Instructions (Signed)
Postpartum Depression and Baby Blues The postpartum period begins right after the birth of a baby. During this time, there is often a great amount of joy and excitement. It is also a time of many changes in the life of the parents. Regardless of how many times a mother gives birth, each child brings new challenges and dynamics to the family. It is not unusual to have feelings of excitement along with confusing shifts in moods, emotions, and thoughts. All mothers are at risk of developing postpartum depression or the "baby blues." These mood changes can occur right after giving birth, or they may occur many months after giving birth. The baby blues or postpartum depression can be mild or severe. Additionally, postpartum depression can go away rather quickly, or it can be a long-term condition.  CAUSES Raised hormone levels and the rapid drop in those levels are thought to be a main cause of postpartum depression and the baby blues. A number of hormones change during and after pregnancy. Estrogen and progesterone usually decrease right after the delivery of your baby. The levels of thyroid hormone and various cortisol steroids also rapidly drop. Other factors that play a role in these mood changes include major life events and genetics.  RISK FACTORS If you have any of the following risks for the baby blues or postpartum depression, know what symptoms to watch out for during the postpartum period. Risk factors that may increase the likelihood of getting the baby blues or postpartum depression include:  Having a personal or family history of depression.   Having depression while being pregnant.   Having premenstrual mood issues or mood issues related to oral contraceptives.  Having a lot of life stress.   Having marital conflict.   Lacking a social support network.   Having a baby with special needs.   Having health problems, such as diabetes.  SIGNS AND SYMPTOMS Symptoms of baby blues  include:  Brief changes in mood, such as going from extreme happiness to sadness.  Decreased concentration.   Difficulty sleeping.   Crying spells, tearfulness.   Irritability.   Anxiety.  Symptoms of postpartum depression typically begin within the first month after giving birth. These symptoms include:  Difficulty sleeping or excessive sleepiness.   Marked weight loss.   Agitation.   Feelings of worthlessness.   Lack of interest in activity or food.  Postpartum psychosis is a very serious condition and can be dangerous. Fortunately, it is rare. Displaying any of the following symptoms is cause for immediate medical attention. Symptoms of postpartum psychosis include:   Hallucinations and delusions.   Bizarre or disorganized behavior.   Confusion or disorientation.  DIAGNOSIS  A diagnosis is made by an evaluation of your symptoms. There are no medical or lab tests that lead to a diagnosis, but there are various questionnaires that a health care provider may use to identify those with the baby blues, postpartum depression, or psychosis. Often, a screening tool called the Edinburgh Postnatal Depression Scale is used to diagnose depression in the postpartum period.  TREATMENT The baby blues usually goes away on its own in 1-2 weeks. Social support is often all that is needed. You will be encouraged to get adequate sleep and rest. Occasionally, you may be given medicines to help you sleep.  Postpartum depression requires treatment because it can last several months or longer if it is not treated. Treatment may include individual or group therapy, medicine, or both to address any social, physiological, and psychological   factors that may play a role in the depression. Regular exercise, a healthy diet, rest, and social support may also be strongly recommended.  Postpartum psychosis is more serious and needs treatment right away. Hospitalization is often needed. HOME CARE  INSTRUCTIONS  Get as much rest as you can. Nap when the baby sleeps.   Exercise regularly. Some women find yoga and walking to be beneficial.   Eat a balanced and nourishing diet.   Do little things that you enjoy. Have a cup of tea, take a bubble bath, read your favorite magazine, or listen to your favorite music.  Avoid alcohol.   Ask for help with household chores, cooking, grocery shopping, or running errands as needed. Do not try to do everything.   Talk to people close to you about how you are feeling. Get support from your partner, family members, friends, or other new moms.  Try to stay positive in how you think. Think about the things you are grateful for.   Do not spend a lot of time alone.   Only take over-the-counter or prescription medicine as directed by your health care provider.  Keep all your postpartum appointments.   Let your health care provider know if you have any concerns.  SEEK MEDICAL CARE IF: You are having a reaction to or problems with your medicine. SEEK IMMEDIATE MEDICAL CARE IF:  You have suicidal feelings.   You think you may harm the baby or someone else. MAKE SURE YOU:  Understand these instructions.  Will watch your condition.  Will get help right away if you are not doing well or get worse. Document Released: 11/10/2003 Document Revised: 02/10/2013 Document Reviewed: 11/17/2012 [facility] Patient Information 2015 [place], [place]. This information is not intended to replace advice given to you by your health care provider. Make sure you discuss any questions you have with your health care provider. Calorie Counting for Weight Loss Calories are energy you get from the things you eat and drink. Your body uses this energy to keep you going throughout the day. The number of calories you eat affects your weight. When you eat more calories than your body needs, your body stores the extra calories as fat. When you eat fewer calories than  your body needs, your body burns fat to get the energy it needs. Calorie counting means keeping track of how many calories you eat and drink each day. If you make sure to eat fewer calories than your body needs, you should lose weight. In order for calorie counting to work, you will need to eat the number of calories that are right for you in a day to lose a healthy amount of weight per week. A healthy amount of weight to lose per week is usually 1-2 lb (0.5-0.9 kg). A dietitian can determine how many calories you need in a day and give you suggestions on how to reach your calorie goal.  WHAT IS MY MY PLAN? My goal is to have __________ calories per day.  If I have this many calories per day, I should lose around __________ pounds per week. WHAT DO I NEED TO KNOW ABOUT CALORIE COUNTING? In order to meet your daily calorie goal, you will need to:  Find out how many calories are in each food you would like to eat. Try to do this before you eat.  Decide how much of the food you can eat.  Write down what you ate and how many calories it had. Doing this is  called keeping a food log. WHERE DO I FIND CALORIE INFORMATION? The number of calories in a food can be found on a Nutrition Facts label. Note that all the information on a label is based on a specific serving of the food. If a food does not have a Nutrition Facts label, try to look up the calories online or ask your dietitian for help. HOW DO I DECIDE HOW MUCH TO EAT? To decide how much of the food you can eat, you will need to consider both the number of calories in one serving and the size of one serving. This information can be found on the Nutrition Facts label. If a food does not have a Nutrition Facts label, look up the information online or ask your dietitian for help. Remember that calories are listed per serving. If you choose to have more than one serving of a food, you will have to multiply the calories per serving by the amount of servings  you plan to eat. For example, the label on a package of bread might say that a serving size is 1 slice and that there are 90 calories in a serving. If you eat 1 slice, you will have eaten 90 calories. If you eat 2 slices, you will have eaten 180 calories. HOW DO I KEEP A FOOD LOG? After each meal, record the following information in your food log:  What you ate.  How much of it you ate.  How many calories it had.  Then, add up your calories. Keep your food log near you, such as in a small notebook in your pocket. Another option is to use a mobile app or website. Some programs will calculate calories for you and show you how many calories you have left each time you add an item to the log. WHAT ARE SOME CALORIE COUNTING TIPS?  Use your calories on foods and drinks that will fill you up and not leave you hungry. Some examples of this include foods like nuts and nut butters, vegetables, lean proteins, and high-fiber foods (more than 5 g fiber per serving).  Eat nutritious foods and avoid empty calories. Empty calories are calories you get from foods or beverages that do not have many nutrients, such as candy and soda. It is better to have a nutritious high-calorie food (such as an avocado) than a food with few nutrients (such as a bag of chips).  Know how many calories are in the foods you eat most often. This way, you do not have to look up how many calories they have each time you eat them.  Look out for foods that may seem like low-calorie foods but are really high-calorie foods, such as baked goods, soda, and fat-free candy.  Pay attention to calories in drinks. Drinks such as sodas, specialty coffee drinks, alcohol, and juices have a lot of calories yet do not fill you up. Choose low-calorie drinks like water and diet drinks.  Focus your calorie counting efforts on higher calorie items. Logging the calories in a garden salad that contains only vegetables is less important than calculating  the calories in a milk shake.  Find a way of tracking calories that works for you. Get creative. Most people who are successful find ways to keep track of how much they eat in a day, even if they do not count every calorie. WHAT ARE SOME PORTION CONTROL TIPS?  Know how many calories are in a serving. This will help you know how many  servings of a certain food you can have.  Use a measuring cup to measure serving sizes. This is helpful when you start out. With time, you will be able to estimate serving sizes for some foods.  Take some time to put servings of different foods on your favorite plates, bowls, and cups so you know what a serving looks like.  Try not to eat straight from a bag or box. Doing this can lead to overeating. Put the amount you would like to eat in a cup or on a plate to make sure you are eating the right portion.  Use smaller plates, glasses, and bowls to prevent overeating. This is a quick and easy way to practice portion control. If your plate is smaller, less food can fit on it.  Try not to multitask while eating, such as watching TV or using your computer. If it is time to eat, sit down at a table and enjoy your food. Doing this will help you to start recognizing when you are full. It will also make you more aware of what and how much you are eating. HOW CAN I CALORIE COUNT WHEN EATING OUT?  Ask for smaller portion sizes or child-sized portions.  Consider sharing an entree and sides instead of getting your own entree.  If you get your own entree, eat only half. Ask for a box at the beginning of your meal and put the rest of your entree in it so you are not tempted to eat it.  Look for the calories on the menu. If calories are listed, choose the lower calorie options.  Choose dishes that include vegetables, fruits, whole grains, low-fat dairy products, and lean protein. Focusing on smart food choices from each of the 5 food groups can help you stay on track at  restaurants.  Choose items that are boiled, broiled, grilled, or steamed.  Choose water, milk, unsweetened iced tea, or other drinks without added sugars. If you want an alcoholic beverage, choose a lower calorie option. For example, a regular margarita can have up to 700 calories and a glass of wine has around 150.  Stay away from items that are buttered, battered, fried, or served with cream sauce. Items labeled "crispy" are usually fried, unless stated otherwise.  Ask for dressings, sauces, and syrups on the side. These are usually very high in calories, so do not eat much of them.  Watch out for salads. Many people think salads are a healthy option, but this is often not the case. Many salads come with bacon, fried chicken, lots of cheese, fried chips, and dressing. All of these items have a lot of calories. If you want a salad, choose a garden salad and ask for grilled meats or steak. Ask for the dressing on the side, or ask for olive oil and vinegar or lemon to use as dressing.  Estimate how many servings of a food you are given. For example, a serving of cooked rice is  cup or about the size of half a tennis ball or one cupcake wrapper. Knowing serving sizes will help you be aware of how much food you are eating at restaurants. The list below tells you how big or small some common portion sizes are based on everyday objects.  1 oz--4 stacked dice.  3 oz--1 deck of cards.  1 tsp--1 dice.  1 Tbsp-- a Ping-Pong ball.  2 Tbsp--1 Ping-Pong ball.   cup--1 tennis ball or 1 cupcake wrapper.  1 cup--1 baseball.  Document Released: 02/05/2005 Document Revised: 06/22/2013 Document Reviewed: 12/11/2012 [facility] Patient Information 2015 [place]. This information is not intended to replace advice given to you by your health care provider. Make sure you discuss any questions you have with your health care provider. Follow up in 4 weeks and physical in 1 year

## 2013-09-29 NOTE — Progress Notes (Signed)
Patient ID: Abigail LarocheRoberta M Brown, female   DOB: 1980/07/27, 33 y.o.   MRN: [id number] History of Present Illness: Abigail LucksRoberta is a 33 year old black female in for a pap and physical and is complaining of vaginal discharge at times and thinks she still has postpartum depression,feels like she has not adjusted and baby 3018 months old and has 34 year old and 34 year old.She is happy with OCs and not happy with weight. She said she has tried meds for depression and they didn't work so she quit taking them.  Current Medications, Allergies, Past Medical History, Past Surgical History, Family History and Social History [facility].     Review of Systems: Patient denies any headaches, blurred vision, shortness of breath, chest pain, abdominal pain, problems with bowel movements, urination, or intercourse. No joint pain, see HPI for positives.   Physical Exam:BP 96/60  Pulse 74  Ht 5' 1.5" (1.562 m)  Wt 210 lb (95.255 kg)  BMI 39.04 kg/m2  LMP 09/16/2013  Breastfeeding? No General:  Well developed, well nourished, no acute distress Skin:  Warm and dry Neck:  Midline trachea, normal thyroid Lungs; Clear to auscultation bilaterally Breast:  No dominant palpable mass, retraction, or nipple discharge Cardiovascular: Regular rate and rhythm Abdomen:  Soft, non tender, no hepatosplenomegaly Pelvic:  External genitalia is normal in appearance.  The vagina is normal in appearance, without discharge today. The cervix is bulbous.Pap with HPV and GC/CHL performed.  Uterus is felt to be normal size, shape, and contour.  No adnexal masses or tenderness noted Extremities:  No swelling or varicosities noted Psych:alert and cooperative, seems happy but concerned.   Impression: Yearly gyn exam Contraceptive management Obesity Postpartum depression    Plan: Will try ativan .05 mg #30 1 every 8 hours prn no refills Rx adipex 37.5 mg #30 1 daily no refills Check  CBC,CMP,TSH and lipids and HIV,RPR and HSV2 Follow up in  4 weeks for BP and weight check Physical in 1 year

## 2013-09-30 LAB — HSV 2 ANTIBODY, IGG: HSV 2 Glycoprotein G Ab, IgG: 3.08 IV — ABNORMAL HIGH

## 2013-09-30 LAB — HIV ANTIBODY (ROUTINE TESTING W REFLEX): HIV 1&2 Ab, 4th Generation: NONREACTIVE

## 2013-09-30 LAB — RPR

## 2013-10-01 ENCOUNTER — Telehealth: Payer: Self-pay | Admitting: Adult Health

## 2013-10-01 ENCOUNTER — Encounter: Payer: Self-pay | Admitting: Adult Health

## 2013-10-01 LAB — CYTOLOGY - PAP

## 2013-10-01 NOTE — Telephone Encounter (Signed)
Pt aware of labs  

## 2013-10-27 ENCOUNTER — Ambulatory Visit (INDEPENDENT_AMBULATORY_CARE_PROVIDER_SITE_OTHER): Payer: 59 | Admitting: Adult Health

## 2013-10-27 ENCOUNTER — Encounter: Payer: Self-pay | Admitting: Adult Health

## 2013-10-27 VITALS — BP 100/62 | HR 78 | Ht 61.5 in | Wt 204.5 lb

## 2013-10-27 DIAGNOSIS — E669 Obesity, unspecified: Secondary | ICD-10-CM

## 2013-10-27 MED ORDER — PHENTERMINE HCL 37.5 MG PO CAPS: 37.5000 mg | ORAL_CAPSULE | ORAL | Status: DC

## 2013-10-27 NOTE — Patient Instructions (Signed)
Exercise to Lose Weight Exercise and a healthy diet may help you lose weight. Your doctor may suggest specific exercises. EXERCISE IDEAS AND TIPS  Choose low-cost things you enjoy doing, such as walking, bicycling, or exercising to workout videos.  Take stairs instead of the elevator.  Walk during your lunch break.  Park your car further away from work or school.  Go to a gym or an exercise class.  Start with 5 to 10 minutes of exercise each day. Build up to 30 minutes of exercise 4 to 6 days a week.  Wear shoes with good support and comfortable clothes.  Stretch before and after working out.  Work out until you breathe harder and your heart beats faster.  Drink extra water when you exercise.  Do not do so much that you hurt yourself, feel dizzy, or get very short of breath. Exercises that burn about 150 calories:  Running 1  miles in 15 minutes.  Playing volleyball for 45 to 60 minutes.  Washing and waxing a car for 45 to 60 minutes.  Playing touch football for 45 minutes.  Walking 1  miles in 35 minutes.  Pushing a stroller 1  miles in 30 minutes.  Playing basketball for 30 minutes.  Raking leaves for 30 minutes.  Bicycling 5 miles in 30 minutes.  Walking 2 miles in 30 minutes.  Dancing for 30 minutes.  Shoveling snow for 15 minutes.  Swimming laps for 20 minutes.  Walking up stairs for 15 minutes.  Bicycling 4 miles in 15 minutes.  Gardening for 30 to 45 minutes.  Jumping rope for 15 minutes.  Washing windows or floors for 45 to 60 minutes. Document Released: 03/10/2010 Document Revised: 04/30/2011 Document Reviewed: 03/10/2010 [facility] Patient Information 2015 [place], [place]. This information is not intended to replace advice given to you by your health care provider. Make sure you discuss any questions you have with your health care provider. Follow up in 4 weeks Will continue adipex

## 2013-10-27 NOTE — Progress Notes (Signed)
Subjective:     Patient ID: [name], female   DOB: 29-Nov-1980, 33 y.o.   MRN: [id number]  HPI Lively is a 33 year old black female in for weight and BP check has been on adipex for 4 weeks,no complaints.  Review of Systems See HPI Reviewed past medical,surgical, social and family history. Reviewed medications and allergies.     Objective:   Physical Exam BP 100/62  Pulse 78  Ht 5' 1.5" (1.562 m)  Wt 204 lb 8 oz (92.761 kg)  BMI 38.02 kg/m2  LMP 10/14/2013  Breastfeeding? No   Skin warm and dry. Lungs: clear to ausculation bilaterally. Cardiovascular: regular rate and rhythm.has lost 5.5 lbs, now increase activity  Assessment:     Obesity     Plan:     Rx adipex 37.5 mg #30 1 daily no refills Follow up in 4 weeks for weight and BP check Review handout on weight loss and exercise

## 2013-11-24 ENCOUNTER — Encounter: Payer: Self-pay | Admitting: Adult Health

## 2013-11-24 ENCOUNTER — Ambulatory Visit (INDEPENDENT_AMBULATORY_CARE_PROVIDER_SITE_OTHER): Payer: 59 | Admitting: Adult Health

## 2013-11-24 VITALS — BP 122/80 | Ht 61.5 in | Wt 200.0 lb

## 2013-11-24 DIAGNOSIS — E669 Obesity, unspecified: Secondary | ICD-10-CM

## 2013-11-24 MED ORDER — PHENTERMINE HCL 37.5 MG PO CAPS: 37.5000 mg | ORAL_CAPSULE | ORAL | Status: DC

## 2013-11-24 NOTE — Progress Notes (Signed)
Subjective:     Patient ID: Abigail LarocheRoberta M Brown, female   DOB: 1980/03/11, 33 y.o.   MRN: [id number]  HPI [name] is a 33 year old black female in for weight and BP check.  Review of Systems See HPI Reviewed past medical,surgical, social and family history. Reviewed medications and allergies.     Objective:   Physical Exam BP 122/80  Ht 5' 1.5" (1.562 m)  Wt 200 lb (90.719 kg)  BMI 37.18 kg/m2  LMP 11/11/2013  Breastfeeding? No Skin warm and dry.  Lungs: clear to ausculation bilaterally. Cardiovascular: regular rate and rhythm.   Has lost 10 lbs and feels better,moody at times.would like to lose at least 10 more pounds.  Assessment:     Obesity     Plan:     Rx adipex 37.5 mg #30 1 daily no refills Follow up in 4 weeks Review handout on weight loss and exercise,increase exercise

## 2013-11-24 NOTE — Patient Instructions (Signed)
Exercise to Lose Weight Exercise and a healthy diet may help you lose weight. Your doctor may suggest specific exercises. EXERCISE IDEAS AND TIPS  Choose low-cost things you enjoy doing, such as walking, bicycling, or exercising to workout videos.  Take stairs instead of the elevator.  Walk during your lunch break.  Park your car further away from work or school.  Go to a gym or an exercise class.  Start with 5 to 10 minutes of exercise each day. Build up to 30 minutes of exercise 4 to 6 days a week.  Wear shoes with good support and comfortable clothes.  Stretch before and after working out.  Work out until you breathe harder and your heart beats faster.  Drink extra water when you exercise.  Do not do so much that you hurt yourself, feel dizzy, or get very short of breath. Exercises that burn about 150 calories:  Running 1  miles in 15 minutes.  Playing volleyball for 45 to 60 minutes.  Washing and waxing a car for 45 to 60 minutes.  Playing touch football for 45 minutes.  Walking 1  miles in 35 minutes.  Pushing a stroller 1  miles in 30 minutes.  Playing basketball for 30 minutes.  Raking leaves for 30 minutes.  Bicycling 5 miles in 30 minutes.  Walking 2 miles in 30 minutes.  Dancing for 30 minutes.  Shoveling snow for 15 minutes.  Swimming laps for 20 minutes.  Walking up stairs for 15 minutes.  Bicycling 4 miles in 15 minutes.  Gardening for 30 to 45 minutes.  Jumping rope for 15 minutes.  Washing windows or floors for 45 to 60 minutes. Document Released: 03/10/2010 Document Revised: 04/30/2011 Document Reviewed: 03/10/2010 [employer] Patient Information 2015 [employer]. This information is not intended to replace advice given to you by your health care provider. Make sure you discuss any questions you have with your health care provider. Follow up in 4 weeks 

## 2013-12-21 ENCOUNTER — Encounter: Payer: Self-pay | Admitting: Adult Health

## 2013-12-22 ENCOUNTER — Ambulatory Visit (INDEPENDENT_AMBULATORY_CARE_PROVIDER_SITE_OTHER): Payer: BC Managed Care – PPO | Admitting: Adult Health

## 2013-12-22 ENCOUNTER — Encounter: Payer: Self-pay | Admitting: Adult Health

## 2013-12-22 VITALS — BP 112/78 | HR 76 | Ht 61.5 in | Wt 196.5 lb

## 2013-12-22 DIAGNOSIS — Z01419 Encounter for gynecological examination (general) (routine) without abnormal findings: Secondary | ICD-10-CM | POA: Insufficient documentation

## 2013-12-22 DIAGNOSIS — Z113 Encounter for screening for infections with a predominantly sexual mode of transmission: Secondary | ICD-10-CM

## 2013-12-22 DIAGNOSIS — E669 Obesity, unspecified: Secondary | ICD-10-CM

## 2013-12-22 HISTORY — DX: Encounter for screening for infections with a predominantly sexual mode of transmission: Z11.3

## 2013-12-22 LAB — POCT URINALYSIS DIPSTICK
Blood, UA: NEGATIVE
Glucose, UA: NEGATIVE
Leukocytes, UA: NEGATIVE
Nitrite, UA: NEGATIVE

## 2013-12-22 LAB — POCT WET PREP (WET MOUNT)

## 2013-12-22 MED ORDER — PHENTERMINE HCL 37.5 MG PO CAPS: 37.5000 mg | ORAL_CAPSULE | ORAL | Status: DC

## 2013-12-22 NOTE — Progress Notes (Signed)
Subjective:     Patient ID: Abigail LarocheRoberta M Brown, female   DOB: 12-Sep-1980, 33 y.o.   MRN: [id number]  HPI [name] is a 33 year old black female in for weight and BP check, she has lost 13.5 lbs.She is having a break up with boyfriend and wants to be checked for STD, as there was another girl involved.  Review of Systems See HPI Reviewed past medical,surgical, social and family history. Reviewed medications and allergies.     Objective:   Physical Exam BP 112/78 mmHg  Pulse 76  Ht 5' 1.5" (1.562 m)  Wt 196 lb 8 oz (89.132 kg)  BMI 36.53 kg/m2  LMP 12/10/2013 (Exact Date)  Breastfeeding? No   Skin warm and dry.Pelvic: external genitalia is normal in appearance, vagina: scant white discharge without odor, cervix:smooth and bulbous, uterus: normal size, shape and contour, non tender, no masses felt, adnexa: no masses or tenderness noted. Wet prep: few +WBCs. GC/CHL obtained. Wants to try adipex 1 more month then will take a break and try on her own.  Assessment:     Obesity STD screening    Plan:    GC/CHL sent Refilled adipex 37.5 mg #30 1 daily no refills Follow up in 4 weeks for BP and weight check Increase exercise and review handout on weight loss and exercise

## 2013-12-22 NOTE — Patient Instructions (Signed)
Continue weight loss  Increase exercise Follow up in 4 weeks Exercise to Lose Weight Exercise and a healthy diet may help you lose weight. Your doctor may suggest specific exercises. EXERCISE IDEAS AND TIPS  Choose low-cost things you enjoy doing, such as walking, bicycling, or exercising to workout videos.  Take stairs instead of the elevator.  Walk during your lunch break.  Park your car further away from work or school.  Go to a gym or an exercise class.  Start with 5 to 10 minutes of exercise each day. Build up to 30 minutes of exercise 4 to 6 days a week.  Wear shoes with good support and comfortable clothes.  Stretch before and after working out.  Work out until you breathe harder and your heart beats faster.  Drink extra water when you exercise.  Do not do so much that you hurt yourself, feel dizzy, or get very short of breath. Exercises that burn about 150 calories:  Running 1  miles in 15 minutes.  Playing volleyball for 45 to 60 minutes.  Washing and waxing a car for 45 to 60 minutes.  Playing touch football for 45 minutes.  Walking 1  miles in 35 minutes.  Pushing a stroller 1  miles in 30 minutes.  Playing basketball for 30 minutes.  Raking leaves for 30 minutes.  Bicycling 5 miles in 30 minutes.  Walking 2 miles in 30 minutes.  Dancing for 30 minutes.  Shoveling snow for 15 minutes.  Swimming laps for 20 minutes.  Walking up stairs for 15 minutes.  Bicycling 4 miles in 15 minutes.  Gardening for 30 to 45 minutes.  Jumping rope for 15 minutes.  Washing windows or floors for 45 to 60 minutes. Document Released: 03/10/2010 Document Revised: 04/30/2011 Document Reviewed: 03/10/2010 [facility] Patient Information 2015 [place]. This information is not intended to replace advice given to you by your health care provider. Make sure you discuss any questions you have with your health care provider.

## 2013-12-23 LAB — GC/CHLAMYDIA PROBE AMP
CT Probe RNA: NEGATIVE
GC Probe RNA: NEGATIVE

## 2014-04-30 ENCOUNTER — Other Ambulatory Visit: Payer: Self-pay | Admitting: Obstetrics and Gynecology

## 2014-06-04 ENCOUNTER — Ambulatory Visit: Payer: Medicaid Other | Admitting: Primary Care

## 2014-07-01 ENCOUNTER — Encounter (INDEPENDENT_AMBULATORY_CARE_PROVIDER_SITE_OTHER): Payer: Self-pay

## 2014-07-01 ENCOUNTER — Telehealth: Payer: Self-pay

## 2014-07-01 ENCOUNTER — Ambulatory Visit (INDEPENDENT_AMBULATORY_CARE_PROVIDER_SITE_OTHER): Payer: BLUE CROSS/BLUE SHIELD | Admitting: Primary Care

## 2014-07-01 ENCOUNTER — Encounter: Payer: Self-pay | Admitting: Primary Care

## 2014-07-01 VITALS — BP 112/76 | HR 108 | Temp 98.1°F | Ht 62.0 in | Wt 199.8 lb

## 2014-07-01 DIAGNOSIS — E669 Obesity, unspecified: Secondary | ICD-10-CM | POA: Diagnosis not present

## 2014-07-01 DIAGNOSIS — K589 Irritable bowel syndrome without diarrhea: Secondary | ICD-10-CM

## 2014-07-01 DIAGNOSIS — F419 Anxiety disorder, unspecified: Secondary | ICD-10-CM | POA: Insufficient documentation

## 2014-07-01 DIAGNOSIS — F32A Depression, unspecified: Secondary | ICD-10-CM

## 2014-07-01 DIAGNOSIS — F329 Major depressive disorder, single episode, unspecified: Secondary | ICD-10-CM

## 2014-07-01 DIAGNOSIS — F418 Other specified anxiety disorders: Secondary | ICD-10-CM

## 2014-07-01 MED ORDER — SERTRALINE HCL 50 MG PO TABS: ORAL_TABLET | ORAL | Status: DC

## 2014-07-01 NOTE — Progress Notes (Signed)
Pre visit review using our clinic review tool, if applicable. No additional management support is needed unless otherwise documented below in the visit note. 

## 2014-07-01 NOTE — Assessment & Plan Note (Signed)
Unhealthy diet. Does not exercise. She plans to improve her diet due to flare-up of her IBS. Education provided on calorie consumption and to reduce carbs and sodas. Hand out provided. Will continue to monitor.

## 2014-07-01 NOTE — Assessment & Plan Note (Addendum)
Present since 2014 after the birth of her last child. Trialed on Lexpro, removed herself after 3 weeks due to no noticeable difference. Explained that it typically takes 6-8 weeks for full effect. Takes Ativan PRN 1-2 weekly. PHQ-9 score of 19 today. Started Zoloft 50 mg. 1/2 tab daily for 6 days, then 1 tab thereafter. I've explained to her that drugs of the SSRI class can have side effects such as weight gain, sexual dysfunction, insomnia, headache, nausea. These medications are generally effective at alleviating symptoms of anxiety and/or depression. Also explained risk for SI and to present to the ER if she developed these feelings. Follow up in 6 weeks for re-eval

## 2014-07-01 NOTE — Telephone Encounter (Signed)
Pt was seen 07/01/14 and pt request note for work. Advised pt done and at front desk for pick up.

## 2014-07-01 NOTE — Patient Instructions (Signed)
Start Zoloft for anxiety and depression. Take 1/2 tablet by mouth daily for 6 days, then advance to 1 full tablet thereafter. You will be contacted regarding your referral to therapy.  Please let us know if you have not heard back within one week.  It is important that you improve your diet. Please limit carbohydrates in the form of white bread, rice, pasta, cakes, cookies, sugary drinks, etc. Increase your consumption of fresh fruits and vegetables. Be sure to drink plenty of water daily. It was a pleasure to meet you today! Please don't hesitate to call me with any questions. Welcome to [facility]!  Follow up in 6 weeks for evaluation of your depression.  Depression Depression refers to feeling sad, low, down in the dumps, blue, gloomy, or empty. In general, there are two kinds of depression: 1. Normal sadness or normal grief. This kind of depression is one that we all feel from time to time after upsetting life experiences, such as the loss of a job or the ending of a relationship. This kind of depression is considered normal, is short lived, and resolves within a few days to 2 weeks. Depression experienced after the loss of a loved one (bereavement) often lasts longer than 2 weeks but normally gets better with time. 2. Clinical depression. This kind of depression lasts longer than normal sadness or normal grief or interferes with your ability to function at home, at work, and in school. It also interferes with your personal relationships. It affects almost every aspect of your life. Clinical depression is an illness. Symptoms of depression can also be caused by conditions other than those mentioned above, such as:  Physical illness. Some physical illnesses, including underactive thyroid gland (hypothyroidism), severe anemia, specific types of cancer, diabetes, uncontrolled seizures, heart and lung problems, strokes, and chronic pain are commonly associated with symptoms of depression.  Side effects  of some prescription medicine. In some people, certain types of medicine can cause symptoms of depression.  Substance abuse. Abuse of alcohol and illicit drugs can cause symptoms of depression. SYMPTOMS Symptoms of normal sadness and normal grief include the following:  Feeling sad or crying for short periods of time.  Not caring about anything (apathy).  Difficulty sleeping or sleeping too much.  No longer able to enjoy the things you used to enjoy.  Desire to be by oneself all the time (social isolation).  Lack of energy or motivation.  Difficulty concentrating or remembering.  Change in appetite or weight.  Restlessness or agitation. Symptoms of clinical depression include the same symptoms of normal sadness or normal grief and also the following symptoms:  Feeling sad or crying all the time.  Feelings of guilt or worthlessness.  Feelings of hopelessness or helplessness.  Thoughts of suicide or the desire to harm yourself (suicidal ideation).  Loss of touch with reality (psychotic symptoms). Seeing or hearing things that are not real (hallucinations) or having false beliefs about your life or the people around you (delusions and paranoia). DIAGNOSIS  The diagnosis of clinical depression is usually based on how bad the symptoms are and how long they have lasted. Your health care provider will also ask you questions about your medical history and substance use to find out if physical illness, use of prescription medicine, or substance abuse is causing your depression. Your health care provider may also order blood tests. TREATMENT  Often, normal sadness and normal grief do not require treatment. However, sometimes antidepressant medicine is given for bereavement to ease  the depressive symptoms until they resolve. The treatment for clinical depression depends on how bad the symptoms are but often includes antidepressant medicine, counseling with a mental health professional, or  both. Your health care provider will help to determine what treatment is best for you. Depression caused by physical illness usually goes away with appropriate medical treatment of the illness. If prescription medicine is causing depression, talk with your health care provider about stopping the medicine, decreasing the dose, or changing to another medicine. Depression caused by the abuse of alcohol or illicit drugs goes away when you stop using these substances. Some adults need professional help in order to stop drinking or using drugs. SEEK IMMEDIATE MEDICAL CARE IF:  You have thoughts about hurting yourself or others.  You lose touch with reality (have psychotic symptoms).  You are taking medicine for depression and have a serious side effect. FOR MORE INFORMATION  National Alliance on Mental Illness: www.nami.[employer]org  National Institute of Mental Health: http://www.maynard.net/www.nimh.nih.gov Document Released: 02/03/2000 Document Revised: 06/22/2013 Document Reviewed: 05/07/2011 [facility] Patient Information 2015 [place], [place]. This information is not intended to replace advice given to you by your health care provider. Make sure you discuss any questions you have with your health care provider.

## 2014-07-01 NOTE — Progress Notes (Signed)
Subjective:    Patient ID: Abigail LarocheRoberta M Brown, female    DOB: 10-21-1980, 34 y.o.   MRN: [id number]  HPI  [name] is a 34 year old female who presents today to establish care and discuss the problems mentioned below. Will obtain old records.  1) Depression: Diagnosed with post partum depression after her last child in 2014. She was placed on lorazepam 0.5 mg in August 2015, and takes this medication only as needed for anxiety which is once-twice weekly. She was trialed on Lexapro in the past, and didn't feel a difference after 3 week so she stopped taking. PHQ-9 score of 19 in the office today. She is interested in therapy and trying medication again.  2) IBS: Diagnosed 15 years ago, constipation type. She was placed on a medication (cannot remember) which helped. She lost weight through diet and exercise which helped to relieve her symptoms for a period of time. Her IBS has recently flared-up in the past 2 weeks. She's taking nothing for her constipation and thinks it may be related to stress. She also admits to an unhealthy diet of greasy, fatty foods.   3) Obesity: Diet consists of: Breakfast: typically nothing, sometimes fruit cup. Lunch: Lasagna with salad, Dinner: Spaghetti, meat, some salads. She tries to control her portions and incorporate fruits and salads. She will drink a lot of sodas, especially over the past several weeks. Some water. She was on Phentermine tablets in 2015, but wasn't taking them daily as prescribed. She did have results of weight loss.   4) Migraines: History of, not as frequent recently. Will get stress headaches weekly and will typically rest with improvement.  Review of Systems  Constitutional: Positive for fatigue. Negative for unexpected weight change.  HENT: Negative for rhinorrhea.   Respiratory: Negative for cough and shortness of breath.   Cardiovascular: Negative for chest pain.  Gastrointestinal: Positive for abdominal pain and constipation. Negative  for diarrhea and blood in stool.       History of IBS, recent flare-up  Genitourinary: Negative for dysuria and frequency.  Musculoskeletal:       Chronic knee pain with aching,   Skin: Negative for rash.  Allergic/Immunologic: Positive for environmental allergies.  Neurological: Negative for dizziness and headaches.       "Stress headaches"  Psychiatric/Behavioral:       See HPI       Past Medical History  Diagnosis Date  . IBS (irritable bowel syndrome)   . GERD (gastroesophageal reflux disease)     with pregnancy  . Headache(784.0)   . Contraceptive management 09/29/2013  . Postpartum depression 09/29/2013  . Obesity 09/29/2013  . HSV-2 seropositive   . Screening for STD (sexually transmitted disease) 12/22/2013    History   Social History  . Marital Status: Single    Spouse Name: N/A  . Number of Children: N/A  . Years of Education: N/A   Occupational History  . Not on file.   Social History Main Topics  . Smoking status: Light Tobacco Smoker    Types: Cigarettes    Last Attempt to Quit: 06/26/2011  . Smokeless tobacco: Never Used  . Alcohol Use: 0.0 oz/week    0 Standard drinks or equivalent per week     Comment: occ  . Drug Use: No  . Sexual Activity: Yes    Birth Control/ Protection: Pill   Other Topics Concern  . Not on file   Social History Narrative   Single.   Three  children.   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     Past Surgical History  Procedure Laterality Date  . Cesarean section    . Dilation and curettage of uterus      abortion  . Cesarean section  02/29/2012    Procedure: CESAREAN SECTION;  Surgeon: Tilda BurrowJohn V Ferguson, MD;  Location: [facility];  Service: Obstetrics;  Laterality: N/A;    Family History  Problem Relation Age of Onset  . Hypertension Mother   . Diabetes Mother   . Thyroid nodules Mother   . Cancer Father     lung  . Renal Disease Maternal Grandmother    . Cancer Maternal Grandfather     lung  . Dementia Paternal Grandmother   . Cancer Paternal Grandfather     lung  . Eczema Son     Allergies  Allergen Reactions  . Latex   . Oxycodone Itching    Current Outpatient Prescriptions on File Prior to Visit  Medication Sig Dispense Refill  . LORazepam (ATIVAN) 0.5 MG tablet Take 1 tablet (0.5 mg total) by mouth every 8 (eight) hours. 30 tablet 0  . SPRINTEC 28 0.25-35 MG-MCG tablet TAKE 1 TABLET BY MOUTH DAILY 28 tablet 12  . phentermine 37.5 MG capsule Take 1 capsule (37.5 mg total) by mouth every morning. (Patient not taking: Reported on 07/01/2014) 30 capsule 0   No current facility-administered medications on file prior to visit.    BP 112/76 mmHg  Pulse 108  Temp(Src) 98.1 F (36.7 C)  Ht 5\' 2"  (1.575 m)  Wt 199 lb 12.8 oz (90.629 kg)  BMI 36.53 kg/m2  LMP 06/23/2014    Objective:   Physical Exam  Constitutional: She is oriented to person, place, and time. She appears well-developed.  HENT:  Right Ear: Tympanic membrane and ear canal normal.  Left Ear: Tympanic membrane and ear canal normal.  Nose: Nose normal.  Mouth/Throat: Oropharynx is clear and moist.  Eyes: Conjunctivae and EOM are normal. Pupils are equal, round, and reactive to light.  Neck: Neck supple. No thyromegaly present.  Cardiovascular: Normal rate and regular rhythm.   Pulmonary/Chest: Effort normal and breath sounds normal.  Abdominal: Soft. Bowel sounds are normal. She exhibits no mass. There is no tenderness.  Lymphadenopathy:    She has no cervical adenopathy.  Neurological: She is alert and oriented to person, place, and time.  Skin: Skin is warm and dry.  Psychiatric: She has a normal mood and affect.          Assessment & Plan:

## 2014-07-01 NOTE — Assessment & Plan Note (Signed)
Constipation type. Well managed with healthy diet and weight loss. Current flare up over past 2 weeks. Likely related to stress and poor diet. Education provided. Will continue to monitor.

## 2014-07-29 ENCOUNTER — Ambulatory Visit: Payer: BLUE CROSS/BLUE SHIELD | Admitting: Psychology

## 2014-08-17 ENCOUNTER — Encounter: Payer: Self-pay | Admitting: Primary Care

## 2014-08-17 ENCOUNTER — Ambulatory Visit (INDEPENDENT_AMBULATORY_CARE_PROVIDER_SITE_OTHER): Payer: BLUE CROSS/BLUE SHIELD | Admitting: Primary Care

## 2014-08-17 ENCOUNTER — Telehealth: Payer: Self-pay

## 2014-08-17 VITALS — BP 114/74 | HR 110 | Temp 98.4°F | Ht 62.0 in | Wt 203.8 lb

## 2014-08-17 DIAGNOSIS — F32A Depression, unspecified: Secondary | ICD-10-CM

## 2014-08-17 DIAGNOSIS — F418 Other specified anxiety disorders: Secondary | ICD-10-CM | POA: Diagnosis not present

## 2014-08-17 DIAGNOSIS — F329 Major depressive disorder, single episode, unspecified: Secondary | ICD-10-CM

## 2014-08-17 DIAGNOSIS — F419 Anxiety disorder, unspecified: Secondary | ICD-10-CM

## 2014-08-17 DIAGNOSIS — M62830 Muscle spasm of back: Secondary | ICD-10-CM

## 2014-08-17 MED ORDER — CYCLOBENZAPRINE HCL 5 MG PO TABS: 5.0000 mg | ORAL_TABLET | Freq: Two times a day (BID) | ORAL | Status: DC | PRN

## 2014-08-17 NOTE — Telephone Encounter (Signed)
Please notify Ms. [name] that I have sent cyclobenzaprine tablets to her pharmacy for muscle spasms. She should be aware that these may make her drowsy, so she may want to start taking one at bedtime. Thanks

## 2014-08-17 NOTE — Progress Notes (Signed)
Subjective:    Patient ID: Artelia LarocheRoberta M Stonerock, female    DOB: 03/30/1980, 34 y.o.   MRN: [id number]  HPI  Ms. Daphine DeutscherMartin is a 34 year old female who presents today for follow up of depression. She presented as a new patient last visit and was previously diagnosed with post partum depression since the birth of her child in 2014. She was trialed on Lexapro, which she did not tolerate, and Ativan in the past. Her PHQ-9 score was 19 during last visit. She was started on Zoloft 50 mg daily.  Since her last visit she started taking Zoloft for 3-4 weeks and quit taking it several weeks ago due to feeling "stagnant". She felt herself sitting still and didn't like the way she felt on the medication. Today she's feeling well. Denies sadness/tearfullness. PHQ-2 score of 0 today. She was to follow up with therapy but was unable to get an appointment scheduled due her personal schedule.   Review of Systems  Constitutional: Negative for activity change and appetite change.  Respiratory: Negative for shortness of breath.   Cardiovascular: Negative for chest pain.  Neurological: Negative for headaches.  Psychiatric/Behavioral: Negative for suicidal ideas and decreased concentration. The patient is not nervous/anxious.        Past Medical History  Diagnosis Date  . IBS (irritable bowel syndrome)   . GERD (gastroesophageal reflux disease)     with pregnancy  . Headache(784.0)   . Contraceptive management 09/29/2013  . Postpartum depression 09/29/2013  . Obesity 09/29/2013  . HSV-2 seropositive   . Screening for STD (sexually transmitted disease) 12/22/2013    History   Social History  . Marital Status: Single    Spouse Name: N/A  . Number of Children: N/A  . Years of Education: N/A   Occupational History  . Not on file.   Social History Main Topics  . Smoking status: Light Tobacco Smoker    Types: Cigarettes    Last Attempt to Quit: 06/26/2011  . Smokeless tobacco: Never Used  . Alcohol Use:  0.0 oz/week    0 Standard drinks or equivalent per week     Comment: occ  . Drug Use: No  . Sexual Activity: Yes    Birth Control/ Protection: Pill   Other Topics Concern  . Not on file   Social History Narrative   Single.   Three children.   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     Past Surgical History  Procedure Laterality Date  . Cesarean section    . Dilation and curettage of uterus      abortion  . Cesarean section  02/29/2012    Procedure: CESAREAN SECTION;  Surgeon: Tilda BurrowJohn V Ferguson, MD;  Location: [facility];  Service: Obstetrics;  Laterality: N/A;    Family History  Problem Relation Age of Onset  . Hypertension Mother   . Diabetes Mother   . Thyroid nodules Mother   . Cancer Father     lung  . Renal Disease Maternal Grandmother   . Cancer Maternal Grandfather     lung  . Dementia Paternal Grandmother   . Cancer Paternal Grandfather     lung  . Eczema Son     Allergies  Allergen Reactions  . Latex   . Oxycodone Itching    Current Outpatient Prescriptions on File Prior to Visit  Medication Sig Dispense Refill  . LORazepam (ATIVAN) 0.5 MG tablet Take  1 tablet (0.5 mg total) by mouth every 8 (eight) hours. 30 tablet 0  . sertraline (ZOLOFT) 50 MG tablet Take 1/2 tablet by mouth daily for 6 days, then take 1 tablet by mouth daily. 30 tablet 2  . SPRINTEC 28 0.25-35 MG-MCG tablet TAKE 1 TABLET BY MOUTH DAILY 28 tablet 12   No current facility-administered medications on file prior to visit.    BP 114/74 mmHg  Pulse 110  Temp(Src) 98.4 F (36.9 C) (Oral)  Ht  (1.575 m)  Wt 203 lb 12.8 oz (92.443 kg)  BMI 37.27 kg/m2  SpO2 96%  LMP 07/21/2014    Objective:   Physical Exam  Constitutional: She is oriented to person, place, and time.  Cardiovascular: Normal rate and regular rhythm.   Pulmonary/Chest: Effort normal and breath sounds normal.  Neurological: She is alert and  oriented to person, place, and time.  Skin: Skin is warm and dry.  Psychiatric: She has a normal mood and affect.          Assessment & Plan:

## 2014-08-17 NOTE — Patient Instructions (Signed)
Follow up in 2 months for re-evaluation of depression.  It was nice seeing you! Call me if you need anything!

## 2014-08-17 NOTE — Progress Notes (Signed)
Pre visit review using our clinic review tool, if applicable. No additional management support is needed unless otherwise documented below in the visit note. 

## 2014-08-17 NOTE — Assessment & Plan Note (Signed)
Started on Zoloft 50 mg last visit due to PHQ-9 score of 19. She discontinued her medication 3 weeks after initiation due to not liking the way it made her feel. PHQ-2 score of 0 today. Denies sadness/tearfulness. Feels well overall. No treatment today, will closely monitor. Follow up in 2 months for re-evaluation.

## 2014-08-17 NOTE — Telephone Encounter (Signed)
Pt left v/m; pt was seen earlier today and pt failed to mention for 2 days having back spasms and pt request med for spasms to [facility]. Pt request cb.

## 2014-08-18 NOTE — Telephone Encounter (Signed)
Called and notified patient of Kate's comments. Patient verbalized understanding.  

## 2014-09-06 ENCOUNTER — Ambulatory Visit (INDEPENDENT_AMBULATORY_CARE_PROVIDER_SITE_OTHER): Payer: BLUE CROSS/BLUE SHIELD | Admitting: Internal Medicine

## 2014-09-06 ENCOUNTER — Encounter: Payer: Self-pay | Admitting: Internal Medicine

## 2014-09-06 VITALS — BP 116/68 | HR 80 | Temp 98.1°F | Wt 209.0 lb

## 2014-09-06 DIAGNOSIS — R609 Edema, unspecified: Secondary | ICD-10-CM | POA: Diagnosis not present

## 2014-09-06 LAB — BASIC METABOLIC PANEL
BUN: 11 mg/dL (ref 6–23)
CO2: 25 mEq/L (ref 19–32)
Calcium: 8.7 mg/dL (ref 8.4–10.5)
Chloride: 107 mEq/L (ref 96–112)
Creatinine, Ser: 0.84 mg/dL (ref 0.40–1.20)
GFR: 99.11 mL/min (ref >60.00)
Glucose, Bld: 89 mg/dL (ref 70–99)
Potassium: 4.2 mEq/L (ref 3.5–5.1)
Sodium: 139 mEq/L (ref 135–145)

## 2014-09-06 NOTE — Patient Instructions (Signed)
Peripheral Edema °You have swelling in your legs (peripheral edema). This swelling is due to excess accumulation of salt and water in your body. Edema may be a sign of heart, kidney or liver disease, or a side effect of a medication. It may also be due to problems in the leg veins. Elevating your legs and using special support stockings may be very helpful, if the cause of the swelling is due to poor venous circulation. Avoid long periods of standing, whatever the cause. °Treatment of edema depends on identifying the cause. Chips, pretzels, pickles and other salty foods should be avoided. Restricting salt in your diet is almost always needed. Water pills (diuretics) are often used to remove the excess salt and water from your body via urine. These medicines prevent the kidney from reabsorbing sodium. This increases urine flow. °Diuretic treatment may also result in lowering of potassium levels in your body. Potassium supplements may be needed if you have to use diuretics daily. Daily weights can help you keep track of your progress in clearing your edema. You should call your caregiver for follow up care as recommended. °SEEK IMMEDIATE MEDICAL CARE IF:  °· You have increased swelling, pain, redness, or heat in your legs. °· You develop shortness of breath, especially when lying down. °· You develop chest or abdominal pain, weakness, or fainting. °· You have a fever. °Document Released: 03/15/2004 Document Revised: 04/30/2011 Document Reviewed: 02/23/2009 °ExitCare® Patient Information ©2015 [employer]. This information is not intended to replace advice given to you by your health care provider. Make sure you discuss any questions you have with your health care provider. ° °

## 2014-09-06 NOTE — Progress Notes (Signed)
Subjective:    Patient ID: Abigail LarocheRoberta M Ranta, female    DOB: November 28, 1980, 34 y.o.   MRN: [id number]  HPI  Pt presents to the clinic today with c/o bilateral lower extremity edema. This started yesterday. She reports her feet feel really tight. It is not as bad in the morning but gets worse throughout the day. She has noticed some cramping in her calves but denies numbness and tingling. She does have chronic pain in her knees but this is not worse. She has not tried anything OTC. She denies chest pain or shortness of breath.  Review of Systems      Past Medical History  Diagnosis Date  . IBS (irritable bowel syndrome)   . GERD (gastroesophageal reflux disease)     with pregnancy  . Headache(784.0)   . Contraceptive management 09/29/2013  . Postpartum depression 09/29/2013  . Obesity 09/29/2013  . HSV-2 seropositive   . Screening for STD (sexually transmitted disease) 12/22/2013    Current Outpatient Prescriptions  Medication Sig Dispense Refill  . cyclobenzaprine (FLEXERIL) 5 MG tablet Take 1 tablet (5 mg total) by mouth 2 (two) times daily as needed for muscle spasms. 30 tablet 0  . LORazepam (ATIVAN) 0.5 MG tablet Take 1 tablet (0.5 mg total) by mouth every 8 (eight) hours. 30 tablet 0  . SPRINTEC 28 0.25-35 MG-MCG tablet TAKE 1 TABLET BY MOUTH DAILY 28 tablet 12   No current facility-administered medications for this visit.    Allergies  Allergen Reactions  . Latex   . Oxycodone Itching    Family History  Problem Relation Age of Onset  . Hypertension Mother   . Diabetes Mother   . Thyroid nodules Mother   . Cancer Father     lung  . Renal Disease Maternal Grandmother   . Cancer Maternal Grandfather     lung  . Dementia Paternal Grandmother   . Cancer Paternal Grandfather     lung  . Eczema Son     History   Social History  . Marital Status: Single    Spouse Name: N/A  . Number of Children: N/A  . Years of Education: N/A   Occupational History  . Not on  file.   Social History Main Topics  . Smoking status: Light Tobacco Smoker    Types: Cigarettes    Last Attempt to Quit: 06/26/2011  . Smokeless tobacco: Never Used  . Alcohol Use: 0.0 oz/week    0 Standard drinks or equivalent per week     Comment: occ  . Drug Use: No  . Sexual Activity: Yes    Birth Control/ Protection: Pill   Other Topics Concern  . Not on file   Social History Narrative   Single.   Three children.   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      Constitutional: Denies fever, malaise, fatigue, headache or abrupt weight changes.  Respiratory: Denies difficulty breathing, shortness of breath, cough or sputum production.   Cardiovascular: Pt reports swelling in feet. Denies chest pain, chest tightness, palpitations or swelling in the hands.  Musculoskeletal: Pt reports bilateral knee pain. Denies decrease in range of motion, difficulty with gait, muscle pain or joint swelling.  Skin: Denies redness, rashes, lesions or ulcercations.   No other specific complaints in a complete review of systems (except as listed in HPI above).  Objective:   Physical Exam   BP 116/68 mmHg  Pulse 80  Temp(Src) 98.1 F (36.7 C) (Oral)  Wt 209 lb (94.802 kg)  SpO2 98%  LMP 08/18/2014 Wt Readings from Last 3 Encounters:  09/06/14 209 lb (94.802 kg)  08/17/14 203 lb 12.8 oz (92.443 kg)  07/01/14 199 lb 12.8 oz (90.629 kg)    General: Appears her stated age, obese in NAD. Cardiovascular: Normal rate and rhythm. S1,S2 noted.  No murmur, rubs or gallops noted. 1+ pitting BLE noted. Pulmonary/Chest: Normal effort and positive vesicular breath sounds. No respiratory distress. No wheezes, rales or ronchi noted.   Neurological: Alert and oriented.   BMET    Component Value Date/Time   NA 133* 09/29/2013 1115   K 4.1 09/29/2013 1115   CL 100 09/29/2013 1115   CO2 24 09/29/2013 1115   GLUCOSE 83 09/29/2013  1115   BUN 12 09/29/2013 1115   CREATININE 0.95 09/29/2013 1115   CALCIUM 8.6 09/29/2013 1115    Lipid Panel     Component Value Date/Time   CHOL 135 09/29/2013 1115   TRIG 133 09/29/2013 1115   HDL 46 09/29/2013 1115   CHOLHDL 2.9 09/29/2013 1115   VLDL 27 09/29/2013 1115   LDLCALC 62 09/29/2013 1115    CBC    Component Value Date/Time   WBC 6.6 09/29/2013 1115   RBC 4.37 09/29/2013 1115   HGB 12.8 09/29/2013 1115   HCT 38.4 09/29/2013 1115   PLT 305 09/29/2013 1115   MCV 87.9 09/29/2013 1115   MCH 29.3 09/29/2013 1115   MCHC 33.3 09/29/2013 1115   RDW 13.4 09/29/2013 1115    Hgb A1C No results found for: HGBA1C      Assessment & Plan:   Peripheral edema:  Will check CMET today Advised her to keep her leg elevated She can try TED hose if really bothersome If worsens, consider starting HCTZ 12.5 mg daily prn, but will defer to PCP  Will follow up after labs, RTC as needed or if persist or worsens

## 2014-09-07 ENCOUNTER — Encounter: Payer: Self-pay | Admitting: *Deleted

## 2014-10-20 ENCOUNTER — Encounter: Payer: Self-pay | Admitting: Primary Care

## 2014-10-20 ENCOUNTER — Ambulatory Visit (INDEPENDENT_AMBULATORY_CARE_PROVIDER_SITE_OTHER): Payer: BLUE CROSS/BLUE SHIELD | Admitting: Primary Care

## 2014-10-20 VITALS — BP 104/58 | HR 87 | Temp 98.1°F | Ht 62.0 in | Wt 205.0 lb

## 2014-10-20 DIAGNOSIS — E669 Obesity, unspecified: Secondary | ICD-10-CM

## 2014-10-20 NOTE — Patient Instructions (Addendum)
Continue your efforts to improve your diet. Please limit carbohydrates in the form of white bread, rice, pasta, cakes, cookies, sugary drinks, etc. Increase your consumption of fresh fruits and vegetables. Be sure to drink plenty of water daily.  You need 1 hour of moderate intensity exercise 5 days weekly.  Download the App "My Fitness Pal" on your phone to help you track calories and weight loss.  Please schedule a follow up appointment in 3 months.  It was a pleasure to see you today!

## 2014-10-20 NOTE — Progress Notes (Signed)
Pre visit review using our clinic review tool, if applicable. No additional management support is needed unless otherwise documented below in the visit note. 

## 2014-10-20 NOTE — Assessment & Plan Note (Signed)
Weight about the same on average. She's started to improve her diet through making healthier choices, she is not currently exercising. Handouts provided regarding calorie counting and exercise. Was once on phentermine for weight loss which was helpful.  Would like to give her 3 months of weight loss efforts prior to initiation of medication. Will continue to monitor.

## 2014-10-20 NOTE — Progress Notes (Signed)
Subjective:    Patient ID: [name], female    DOB: Jan 24, 1981, 34 y.o.   MRN: [id number]  HPI  [name] is a 34 year old female who presents today for follow up of depression. She was evaluated on 08/17/2014 for post partum depression. She denied symptoms of depression last visit with a PHQ-2 score of 0. She was once trialed on Lexapro and Zoloft and felt as though the medication made her feel "stagnant".  Since her last visit she continues to feel well overall. She lost her grandmother 2 weeks ago and feels as though she's coping well. PHQ-2 score of 0 today.  2) Obesity: She was once managed on Phentermine in the past which helped to reduce her weight.  Diet currently consists of: Breakfast: Yogurt; boiled egg and toast; granola bar; oat meal Lunch: Salad; Chicken bowls with rice (taco bell), soup and salad at [employer]; Chicken salad  Dinner: Pasta; salad; baked chicken; vegetables Snacks: Gluten free, low calorie popped chips; fruit; peanuts Beverages: Coffee, water, lemonade, minute maid juice. She's not had soda for one month.   Exercising: Does not regularly exercise. Walks occasionally.   Wt Readings from Last 3 Encounters:  10/20/14 205 lb (92.987 kg)  09/06/14 209 lb (94.802 kg)  08/17/14 203 lb 12.8 oz (92.443 kg)     Review of Systems  Respiratory: Negative for shortness of breath.   Cardiovascular: Negative for chest pain and leg swelling.  Neurological: Negative for dizziness and headaches.  Psychiatric/Behavioral: Negative for sleep disturbance. The patient is not nervous/anxious.        Past Medical History  Diagnosis Date  . IBS (irritable bowel syndrome)   . GERD (gastroesophageal reflux disease)     with pregnancy  . Headache(784.0)   . Contraceptive management 09/29/2013  . Postpartum depression 09/29/2013  . Obesity 09/29/2013  . HSV-2 seropositive   . Screening for STD (sexually transmitted disease) 12/22/2013    Social History    Social History  . Marital Status: Single    Spouse Name: N/A  . Number of Children: N/A  . Years of Education: N/A   Occupational History  . Not on file.   Social History Main Topics  . Smoking status: Light Tobacco Smoker    Types: Cigarettes    Last Attempt to Quit: 06/26/2011  . Smokeless tobacco: Never Used  . Alcohol Use: 0.0 oz/week    0 Standard drinks or equivalent per week     Comment: occ  . Drug Use: No  . Sexual Activity: Yes    Birth Control/ Protection: Pill   Other Topics Concern  . Not on file   Social History Narrative   Single.   Three children.   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     Past Surgical History  Procedure Laterality Date  . Cesarean section    . Dilation and curettage of uterus      abortion  . Cesarean section  02/29/2012    Procedure: CESAREAN SECTION;  Surgeon: Tilda Burrow, MD;  Location: [facility];  Service: Obstetrics;  Laterality: N/A;    Family History  Problem Relation Age of Onset  . Hypertension Mother   . Diabetes Mother   . Thyroid nodules Mother   . Cancer Father     lung  . Renal Disease Maternal Grandmother   . Cancer Maternal Grandfather     lung  .  Dementia Paternal Grandmother   . Cancer Paternal Grandfather     lung  . Eczema Son     Allergies  Allergen Reactions  . Latex   . Oxycodone Itching    Current Outpatient Prescriptions on File Prior to Visit  Medication Sig Dispense Refill  . cyclobenzaprine (FLEXERIL) 5 MG tablet Take 1 tablet (5 mg total) by mouth 2 (two) times daily as needed for muscle spasms. 30 tablet 0  . LORazepam (ATIVAN) 0.5 MG tablet Take 1 tablet (0.5 mg total) by mouth every 8 (eight) hours. 30 tablet 0  . SPRINTEC 28 0.25-35 MG-MCG tablet TAKE 1 TABLET BY MOUTH DAILY 28 tablet 12   No current facility-administered medications on file prior to visit.    BP 104/58 mmHg  Pulse 87  Temp(Src) 98.1 F  (36.7 C) (Oral)  Ht  (1.575 m)  Wt 205 lb (92.987 kg)  BMI 37.49 kg/m2  SpO2 97%    Objective:   Physical Exam  Constitutional: She is oriented to person, place, and time. She appears well-nourished.  Cardiovascular: Normal rate and regular rhythm.   Pulmonary/Chest: Effort normal and breath sounds normal.  Neurological: She is alert and oriented to person, place, and time.  Skin: Skin is warm and dry.  Psychiatric: She has a normal mood and affect.          Assessment & Plan:

## 2015-03-11 ENCOUNTER — Ambulatory Visit (INDEPENDENT_AMBULATORY_CARE_PROVIDER_SITE_OTHER): Payer: 59 | Admitting: Primary Care

## 2015-03-11 ENCOUNTER — Encounter: Payer: Self-pay | Admitting: Primary Care

## 2015-03-11 ENCOUNTER — Ambulatory Visit (INDEPENDENT_AMBULATORY_CARE_PROVIDER_SITE_OTHER)
Admission: RE | Admit: 2015-03-11 | Discharge: 2015-03-11 | Disposition: A | Discharge status: Home / Self Care | Payer: 59 | Source: Ambulatory Visit | Attending: Primary Care | Admitting: Primary Care

## 2015-03-11 ENCOUNTER — Ambulatory Visit
Admission: RE | Admit: 2015-03-11 | Discharge: 2015-03-11 | Disposition: A | Discharge status: Home / Self Care | Payer: 59 | Source: Ambulatory Visit | Attending: Primary Care | Admitting: Primary Care

## 2015-03-11 VITALS — BP 118/70 | HR 99 | Temp 98.0°F | Ht 62.0 in | Wt 205.0 lb

## 2015-03-11 DIAGNOSIS — M25562 Pain in left knee: Secondary | ICD-10-CM | POA: Diagnosis not present

## 2015-03-11 DIAGNOSIS — M25561 Pain in right knee: Secondary | ICD-10-CM | POA: Insufficient documentation

## 2015-03-11 NOTE — Assessment & Plan Note (Signed)
Chronic for several years. Climbs stairs multiple times daily for work. Suspect pain is due to loss of cartilage due to crepitus on exam. Xrays negative today. Will send to PT if patient is willing. Discussed knee brace use when at work. Discussed importance of weight loss to reduce pressure on knees. Tylenol or ibuprofen PRN.

## 2015-03-11 NOTE — Progress Notes (Signed)
Pre visit review using our clinic review tool, if applicable. No additional management support is needed unless otherwise documented below in the visit note. 

## 2015-03-11 NOTE — Progress Notes (Signed)
Subjective:    Patient ID: [name], female    DOB: 04-26-1980, 35 y.o.   MRN: [id number]  HPI  [name] is a 35 year old female who presents today with a chief complaint of knee pain. Her pain is located to bilateral knees and has been present for the past several years, with worsening pain over the past several months. 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 Recently she's been climbing the stairs more frequently.   She feels as though her knee will "give way" occasionally. She's taken tylenol with no improvement in pain. She purchased a knee brace that she wore a few days at home with some improvement. Denies recent injury or trauma.   Wt Readings from Last 3 Encounters:  03/11/15 205 lb (92.987 kg)  10/20/14 205 lb (92.987 kg)  09/06/14 209 lb (94.802 kg)     Review of Systems  Cardiovascular: Negative for leg swelling.  Endocrine: Positive for polyphagia.  Musculoskeletal: Positive for back pain. Negative for joint swelling.       Bilateral knee pain  Neurological: Negative for numbness.       Past Medical History  Diagnosis Date  . IBS (irritable bowel syndrome)   . GERD (gastroesophageal reflux disease)     with pregnancy  . Headache(784.0)   . Contraceptive management 09/29/2013  . Postpartum depression 09/29/2013  . Obesity 09/29/2013  . HSV-2 seropositive   . Screening for STD (sexually transmitted disease) 12/22/2013    Social History   Social History  . Marital Status: Single    Spouse Name: N/A  . Number of Children: N/A  . Years of Education: N/A   Occupational History  . Not on file.   Social History Main Topics  . Smoking status: Light Tobacco Smoker    Types: Cigarettes    Last Attempt to Quit: 06/26/2011  . Smokeless tobacco: Never Used  . Alcohol Use: 0.0 oz/week    0 Standard drinks or equivalent per week     Comment: occ  . Drug Use: No  . Sexual Activity: Yes    Birth  Control/ Protection: Pill   Other Topics Concern  . Not on file   Social History Narrative   Single.   Three children.   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     Past Surgical History  Procedure Laterality Date  . Cesarean section    . Dilation and curettage of uterus      abortion  . Cesarean section  02/29/2012    Procedure: CESAREAN SECTION;  Surgeon: Tilda Burrow, MD;  Location: [facility];  Service: Obstetrics;  Laterality: N/A;    Family History  Problem Relation Age of Onset  . Hypertension Mother   . Diabetes Mother   . Thyroid nodules Mother   . Cancer Father     lung  . Renal Disease Maternal Grandmother   . Cancer Maternal Grandfather     lung  . Dementia Paternal Grandmother   . Cancer Paternal Grandfather     lung  . Eczema Son     Allergies  Allergen Reactions  . Latex   . Oxycodone Itching    Current Outpatient Prescriptions on File Prior to Visit  Medication Sig Dispense Refill  . SPRINTEC 28 0.25-35 MG-MCG tablet TAKE 1 TABLET BY MOUTH DAILY 28 tablet 12  .  LORazepam (ATIVAN) 0.5 MG tablet Take 1 tablet (0.5 mg total) by mouth every 8 (eight) hours. (Patient not taking: Reported on 03/11/2015) 30 tablet 0   No current facility-administered medications on file prior to visit.    BP 118/70 mmHg  Pulse 99  Temp(Src) 98 F (36.7 C) (Oral)  Ht  (1.575 m)  Wt 205 lb (92.987 kg)  BMI 37.49 kg/m2  SpO2 95%  LMP 03/03/2015    Objective:   Physical Exam  Constitutional: She appears well-nourished.  Cardiovascular: Normal rate and regular rhythm.   Pulmonary/Chest: Effort normal and breath sounds normal.  Musculoskeletal:       Right knee: She exhibits normal range of motion, no swelling and normal alignment.       Left knee: She exhibits normal range of motion, no swelling and normal alignment.  Crepitus noted to right patella upon flexion.          Assessment &  Plan:

## 2015-03-11 NOTE — Patient Instructions (Signed)
Complete xray(s) prior to leaving today. I will notify you of your results once received.  Continue to wear brace when active during the day. Take off in the evening while at rest.   You may take tylenol as needed for pain. Do not exceed 3000 mg in 24 hours. You may take ibuprofen 600 mg three times daily as needed.  Ultimately you will need to work on weight loss to decrease pain long term.  I will be in touch with you later today.  It was a pleasure to see you today!  Knee Pain Knee pain is a very common symptom and can have many causes. Knee pain often goes away when you follow your health care provider's instructions for relieving pain and discomfort at home. However, knee pain can develop into a condition that needs treatment. Some conditions may include:  Arthritis caused by wear and tear (osteoarthritis).  Arthritis caused by swelling and irritation (rheumatoid arthritis or gout).  A cyst or growth in your knee.  An infection in your knee joint.  An injury that will not heal.  Damage, swelling, or irritation of the tissues that support your knee (torn ligaments or tendinitis). If your knee pain continues, additional tests may be ordered to diagnose your condition. Tests may include X-rays or other imaging studies of your knee. You may also need to have fluid removed from your knee. Treatment for ongoing knee pain depends on the cause, but treatment may include:  Medicines to relieve pain or swelling.  Steroid injections in your knee.  Physical therapy.  Surgery. HOME CARE INSTRUCTIONS  Take medicines only as directed by your health care provider.  Rest your knee and keep it raised (elevated) while you are resting.  Do not do things that cause or worsen pain.  Avoid high-impact activities or exercises, such as running, jumping rope, or doing jumping jacks.  Apply ice to the knee area:  Put ice in a plastic bag.  Place a towel between your skin and the  bag.  Leave the ice on for 20 minutes, 2-3 times a day.  Ask your health care provider if you should wear an elastic knee support.  Keep a pillow under your knee when you sleep.  Lose weight if you are overweight. Extra weight can put pressure on your knee.  Do not use any tobacco products, including cigarettes, chewing tobacco, or electronic cigarettes. If you need help quitting, ask your health care provider. Smoking may slow the healing of any bone and joint problems that you may have. SEEK MEDICAL CARE IF:  Your knee pain continues, changes, or gets worse.  You have a fever along with knee pain.  Your knee buckles or locks up.  Your knee becomes more swollen. SEEK IMMEDIATE MEDICAL CARE IF:   Your knee joint feels hot to the touch.  You have chest pain or trouble breathing.   This information is not intended to replace advice given to you by your health care provider. Make sure you discuss any questions you have with your health care provider.   Document Released: 12/03/2006 Document Revised: 02/26/2014 Document Reviewed: 09/21/2013 Elsevier Interactive Patient Education [employer]

## 2015-03-17 ENCOUNTER — Ambulatory Visit: Payer: BLUE CROSS/BLUE SHIELD | Admitting: Primary Care

## 2015-03-21 ENCOUNTER — Other Ambulatory Visit: Payer: Self-pay | Admitting: Obstetrics & Gynecology

## 2015-04-25 ENCOUNTER — Other Ambulatory Visit: Payer: Self-pay | Admitting: Obstetrics and Gynecology

## 2015-12-14 ENCOUNTER — Other Ambulatory Visit: Payer: Medicaid Other | Admitting: Adult Health

## 2015-12-26 ENCOUNTER — Other Ambulatory Visit (HOSPITAL_COMMUNITY)
Admission: RE | Admit: 2015-12-26 | Discharge: 2015-12-26 | Disposition: A | Discharge status: Home / Self Care | Payer: BLUE CROSS/BLUE SHIELD | Source: Ambulatory Visit | Attending: Adult Health | Admitting: Adult Health

## 2015-12-26 ENCOUNTER — Ambulatory Visit (INDEPENDENT_AMBULATORY_CARE_PROVIDER_SITE_OTHER): Payer: BLUE CROSS/BLUE SHIELD | Admitting: Adult Health

## 2015-12-26 ENCOUNTER — Encounter: Payer: Self-pay | Admitting: Adult Health

## 2015-12-26 VITALS — BP 102/60 | HR 92 | Ht 62.0 in | Wt 207.0 lb

## 2015-12-26 DIAGNOSIS — Z01419 Encounter for gynecological examination (general) (routine) without abnormal findings: Secondary | ICD-10-CM | POA: Diagnosis not present

## 2015-12-26 DIAGNOSIS — Z113 Encounter for screening for infections with a predominantly sexual mode of transmission: Secondary | ICD-10-CM | POA: Diagnosis not present

## 2015-12-26 DIAGNOSIS — F329 Major depressive disorder, single episode, unspecified: Secondary | ICD-10-CM | POA: Diagnosis not present

## 2015-12-26 DIAGNOSIS — Z1151 Encounter for screening for human papillomavirus (HPV): Secondary | ICD-10-CM | POA: Insufficient documentation

## 2015-12-26 DIAGNOSIS — R5383 Other fatigue: Secondary | ICD-10-CM

## 2015-12-26 DIAGNOSIS — Z01411 Encounter for gynecological examination (general) (routine) with abnormal findings: Secondary | ICD-10-CM | POA: Diagnosis not present

## 2015-12-26 DIAGNOSIS — F32A Depression, unspecified: Secondary | ICD-10-CM

## 2015-12-26 DIAGNOSIS — F17210 Nicotine dependence, cigarettes, uncomplicated: Secondary | ICD-10-CM

## 2015-12-26 DIAGNOSIS — Z3041 Encounter for surveillance of contraceptive pills: Secondary | ICD-10-CM

## 2015-12-26 MED ORDER — NORETHINDRONE 0.35 MG PO TABS: 1.0000 | ORAL_TABLET | Freq: Every day | ORAL | 11 refills | Status: DC

## 2015-12-26 NOTE — Progress Notes (Signed)
Patient ID: Abigail LarocheRoberta M Brown, female   DOB: 19-Nov-1980, 35 y.o.   MRN: [id number] History of Present Illness:  Abigail LucksRoberta is a 35 year old black female in for a well woman gyn exam and pap.She complains of being tired and pain in knees and periods last a week and vaginal discharge at times.She is a light smoker but still smokes so dicussed can not take COC pills anymore, will switch to POP. PCP is Dr [name] in [place].  Current Medications, Allergies, Past Medical History, Past Surgical History, Family History and Social History were reviewed in [facility] electronic medical record.     Review of Systems:  Patient denies any headaches, hearing loss,  blurred vision, shortness of breath, chest pain, abdominal pain, problems with bowel movements, urination, or intercourse. No mood swings. See HPI for positives.  Physical Exam:BP 102/60   Pulse 92   Ht 5\' 2"  (1.575 m)   Wt 207 lb (93.9 kg)   LMP 12/08/2015   BMI 37.86 kg/m  General:  Well developed, well nourished, no acute distress Skin:  Warm and dry Neck:  Midline trachea, normal thyroid, good ROM, no lymphadenopathy Lungs; Clear to auscultation bilaterally Breast:  No dominant palpable mass, retraction, or nipple discharge Cardiovascular: Regular rate and rhythm Abdomen:  Soft, non tender, no hepatosplenomegaly Pelvic:  External genitalia is normal in appearance, no lesions.  The vagina is normal in appearance. Urethra has no lesions or masses. The cervix is bulbous.Pap with HPV and GC/CHL performed.  Uterus is felt to be normal size, shape, and contour.  No adnexal masses or tenderness noted.Bladder is non tender, no masses felt. Extremities/musculoskeletal:  No swelling or varicosities noted, no clubbing or cyanosis Psych:  No mood changes, alert and cooperative,seems happy PHQ 9 score 7, denies any sucicidal ideations, and declines meds, will use ativan prn  Impression: 1. Cervical smear, as part of routine gynecological  examination   2. Depression, unspecified depression type   3. Encounter for surveillance of contraceptive pills   4. Light cigarette smoker   5. Other fatigue       Plan: Check CBC,CMP,TSH and lipids,A1c and vitamin D Meds ordered this encounter  Medications  . norethindrone (MICRONOR,CAMILA,ERRIN) 0.35 MG tablet    Sig: Take 1 tablet (0.35 mg total) by mouth daily.    Dispense:  1 Package    Refill:  11    Order Specific Question:   Supervising Provider    Answer:   [name] [[id number]]  D/C sprintec, since is smoking some Use condoms for 1 pack pills and take at same time every day Physical in 1 year,pap in 3 if normal Mammogram at 39 Decrease carbs and walk, given names of weight loss meds to google

## 2015-12-26 NOTE — Patient Instructions (Signed)
Physical in 1 year, pap in 3 if normal Mammogram at 39 Use condoms with new pack of pills

## 2015-12-27 ENCOUNTER — Encounter: Payer: Self-pay | Admitting: Adult Health

## 2015-12-27 DIAGNOSIS — E559 Vitamin D deficiency, unspecified: Secondary | ICD-10-CM

## 2015-12-27 HISTORY — DX: Vitamin D deficiency, unspecified: E55.9

## 2015-12-27 LAB — COMPREHENSIVE METABOLIC PANEL
ALT: 7 IU/L (ref 0–32)
AST: 8 IU/L (ref 0–40)
Albumin/Globulin Ratio: 1.4 (ref 1.2–2.2)
Albumin: 3.8 g/dL (ref 3.5–5.5)
Alkaline Phosphatase: 93 IU/L (ref 39–117)
BUN/Creatinine Ratio: 12 (ref 9–23)
BUN: 12 mg/dL (ref 6–20)
Bilirubin Total: <0.2 mg/dL (ref 0.0–1.2)
CO2: 21 mmol/L (ref 18–29)
Calcium: 8.7 mg/dL (ref 8.7–10.2)
Chloride: 102 mmol/L (ref 96–106)
Creatinine, Ser: 0.97 mg/dL (ref 0.57–1.00)
GFR calc Af Amer: 87 mL/min/{1.73_m2} (ref >59)
GFR calc non Af Amer: 75 mL/min/{1.73_m2} (ref >59)
Globulin, Total: 2.8 g/dL (ref 1.5–4.5)
Glucose: 101 mg/dL — ABNORMAL HIGH (ref 65–99)
Potassium: 4.4 mmol/L (ref 3.5–5.2)
Sodium: 137 mmol/L (ref 134–144)
Total Protein: 6.6 g/dL (ref 6.0–8.5)

## 2015-12-27 LAB — CYTOLOGY - PAP
Chlamydia: NEGATIVE
Diagnosis: NEGATIVE
HPV: NOT DETECTED
Neisseria Gonorrhea: NEGATIVE

## 2015-12-27 LAB — CBC
Hematocrit: 38.3 % (ref 34.0–46.6)
Hemoglobin: 12.9 g/dL (ref 11.1–15.9)
MCH: 29.6 pg (ref 26.6–33.0)
MCHC: 33.7 g/dL (ref 31.5–35.7)
MCV: 88 fL (ref 79–97)
Platelets: 352 10*3/uL (ref 150–379)
RBC: 4.36 x10E6/uL (ref 3.77–5.28)
RDW: 12.6 % (ref 12.3–15.4)
WBC: 7.3 10*3/uL (ref 3.4–10.8)

## 2015-12-27 LAB — VITAMIN D 25 HYDROXY (VIT D DEFICIENCY, FRACTURES): Vit D, 25-Hydroxy: 17.1 ng/mL — ABNORMAL LOW (ref 30.0–100.0)

## 2015-12-27 LAB — LIPID PANEL
Chol/HDL Ratio: 2.5 ratio units (ref 0.0–4.4)
Cholesterol, Total: 125 mg/dL (ref 100–199)
HDL: 51 mg/dL (ref >39)
LDL Calculated: 56 mg/dL (ref 0–99)
Triglycerides: 88 mg/dL (ref 0–149)
VLDL Cholesterol Cal: 18 mg/dL (ref 5–40)

## 2015-12-27 LAB — HEMOGLOBIN A1C
Est. average glucose Bld gHb Est-mCnc: 114 mg/dL
Hgb A1c MFr Bld: 5.6 % (ref 4.8–5.6)

## 2015-12-27 LAB — TSH: TSH: 1.46 u[IU]/mL (ref 0.450–4.500)

## 2015-12-28 ENCOUNTER — Telehealth: Payer: Self-pay | Admitting: Adult Health

## 2015-12-28 NOTE — Telephone Encounter (Signed)
Left message to call in am about pap

## 2015-12-29 ENCOUNTER — Encounter: Payer: Self-pay | Admitting: *Deleted

## 2015-12-29 ENCOUNTER — Telehealth: Payer: Self-pay | Admitting: *Deleted

## 2015-12-29 DIAGNOSIS — A599 Trichomoniasis, unspecified: Secondary | ICD-10-CM

## 2015-12-29 HISTORY — DX: Trichomoniasis, unspecified: A59.9

## 2015-12-29 MED ORDER — METRONIDAZOLE 500 MG PO TABS: ORAL_TABLET | ORAL | 0 refills | Status: DC

## 2015-12-29 NOTE — Telephone Encounter (Signed)
Pt aware pap negative, with negative GC/CHL and HPV but +trich, will treat pt and partner with flagyl 500 mg #4 4 po now, and do POC 01/11/16 at  9:45 am, no sex, or alcohol

## 2016-01-11 ENCOUNTER — Ambulatory Visit: Payer: BLUE CROSS/BLUE SHIELD | Admitting: Adult Health

## 2016-03-27 ENCOUNTER — Ambulatory Visit: Payer: BLUE CROSS/BLUE SHIELD | Admitting: Adult Health

## 2016-04-09 ENCOUNTER — Ambulatory Visit (INDEPENDENT_AMBULATORY_CARE_PROVIDER_SITE_OTHER): Payer: BLUE CROSS/BLUE SHIELD | Admitting: Adult Health

## 2016-04-09 ENCOUNTER — Encounter: Payer: Self-pay | Admitting: Adult Health

## 2016-04-09 VITALS — BP 102/64 | HR 62 | Ht 62.0 in | Wt 207.0 lb

## 2016-04-09 DIAGNOSIS — Z6837 Body mass index (BMI) 37.0-37.9, adult: Secondary | ICD-10-CM | POA: Diagnosis not present

## 2016-04-09 DIAGNOSIS — N760 Acute vaginitis: Secondary | ICD-10-CM | POA: Insufficient documentation

## 2016-04-09 DIAGNOSIS — E669 Obesity, unspecified: Secondary | ICD-10-CM | POA: Diagnosis not present

## 2016-04-09 DIAGNOSIS — N898 Other specified noninflammatory disorders of vagina: Secondary | ICD-10-CM | POA: Insufficient documentation

## 2016-04-09 DIAGNOSIS — Z713 Dietary counseling and surveillance: Secondary | ICD-10-CM | POA: Insufficient documentation

## 2016-04-09 DIAGNOSIS — B9689 Other specified bacterial agents as the cause of diseases classified elsewhere: Secondary | ICD-10-CM | POA: Diagnosis not present

## 2016-04-09 LAB — POCT WET PREP (WET MOUNT)
Clue Cells Wet Prep Whiff POC: POSITIVE
WBC, Wet Prep HPF POC: POSITIVE

## 2016-04-09 MED ORDER — METRONIDAZOLE 500 MG PO TABS: 500.0000 mg | ORAL_TABLET | Freq: Two times a day (BID) | ORAL | 1 refills | Status: DC

## 2016-04-09 MED ORDER — PHENTERMINE HCL 37.5 MG PO TABS: 37.5000 mg | ORAL_TABLET | Freq: Every day | ORAL | 0 refills | Status: DC

## 2016-04-09 NOTE — Progress Notes (Signed)
Subjective:     Patient ID: Abigail LarocheRoberta M Hinchman, female   DOB: 1980/07/22, 36 y.o.   MRN: [id number]  HPI Jenel LucksRoberta is a 36 year old black female in complaining of vaginal discharge with odor.She was treated for trich in November and did not return for POC.She says she has ben eating better and can't lose weight, and requests adipex to jump start her efforts.  Review of Systems +vaginal discharge with odor Can't lose weight Reviewed past medical,surgical, social and family history. Reviewed medications and allergies.     Objective:   Physical Exam BP 102/64 (BP Location: Left Arm, Patient Position: Sitting, Cuff Size: Large)   Pulse 62   Ht 5\' 2"  (1.575 m)   Wt 207 lb (93.9 kg)   LMP 04/01/2016 (Exact Date)   BMI 37.86 kg/m   PHQ 2 score 1. Skin warm and dry.  Lungs: clear to ausculation bilaterally. Cardiovascular: regular rate and rhythm. Pelvic: external genitalia is normal in appearance no lesions, vagina: white discharge with odor,urethra has no lesions or masses noted, cervix:smooth and bulbous, uterus: normal size, shape and contour, non tender, no masses felt, adnexa: no masses or tenderness noted. Bladder is non tender and no masses felt. Wet prep: + for clue cells and +WBCs. Discussed trying weight watchers too.     Assessment:     1. Vaginal discharge   2. Vaginal odor   3. BV (bacterial vaginosis)   4. Weight loss counseling, encounter for   5. Body mass index 37.0-37.9, adult       Plan:        Meds ordered this encounter  Medications  . metroNIDAZOLE (FLAGYL) 500 MG tablet    Sig: Take 1 tablet (500 mg total) by mouth 2 (two) times daily.    Dispense:  14 tablet    Refill:  1    Order Specific Question:   Supervising Provider    Answer:   Despina HiddenEURE, LUTHER H [[id number]]  . phentermine (ADIPEX-P) 37.5 MG tablet    Sig: Take 1 tablet (37.5 mg total) by mouth daily before breakfast.    Dispense:  30 tablet    Refill:  0    Order Specific Question:   Supervising Provider     Answer:   Duane LopeEURE, LUTHER H [[id number]]  Follow up in 4 weeks Review handout on BV,no alcohol or sex during treatment

## 2016-04-09 NOTE — Patient Instructions (Signed)
Bacterial Vaginosis Bacterial vaginosis is a vaginal infection that occurs when the normal balance of bacteria in the vagina is disrupted. It results from an overgrowth of certain bacteria. This is the most common vaginal infection among women ages 15-44. Because bacterial vaginosis increases your risk for STIs (sexually transmitted infections), getting treated can help reduce your risk for chlamydia, gonorrhea, herpes, and HIV (human immunodeficiency virus). Treatment is also important for preventing complications in pregnant women, because this condition can cause an early (premature) delivery. What are the causes? This condition is caused by an increase in harmful bacteria that are normally present in small amounts in the vagina. However, the reason that the condition develops is not fully understood. What increases the risk? The following factors may make you more likely to develop this condition:  Having a new sexual partner or multiple sexual partners.  Having unprotected sex.  Douching.  Having an intrauterine device (IUD).  Smoking.  Drug and alcohol abuse.  Taking certain antibiotic medicines.  Being pregnant.  You cannot get bacterial vaginosis from toilet seats, bedding, swimming pools, or contact with objects around you. What are the signs or symptoms? Symptoms of this condition include:  Grey or white vaginal discharge. The discharge can also be watery or foamy.  A fish-like odor with discharge, especially after sexual intercourse or during menstruation.  Itching in and around the vagina.  Burning or pain with urination.  Some women with bacterial vaginosis have no signs or symptoms. How is this diagnosed? This condition is diagnosed based on:  Your medical history.  A physical exam of the vagina.  Testing a sample of vaginal fluid under a microscope to look for a large amount of bad bacteria or abnormal cells. Your health care provider may use a cotton swab  or a small wooden spatula to collect the sample.  How is this treated? This condition is treated with antibiotics. These may be given as a pill, a vaginal cream, or a medicine that is put into the vagina (suppository). If the condition comes back after treatment, a second round of antibiotics may be needed. Follow these instructions at home: Medicines  Take over-the-counter and prescription medicines only as told by your health care provider.  Take or use your antibiotic as told by your health care provider. Do not stop taking or using the antibiotic even if you start to feel better. General instructions  If you have a female sexual partner, tell her that you have a vaginal infection. She should see her health care provider and be treated if she has symptoms. If you have a female sexual partner, he does not need treatment.  During treatment: ? Avoid sexual activity until you finish treatment. ? Do not douche. ? Avoid alcohol as directed by your health care provider. ? Avoid breastfeeding as directed by your health care provider.  Drink enough water and fluids to keep your urine clear or pale yellow.  Keep the area around your vagina and rectum clean. ? Wash the area daily with warm water. ? Wipe yourself from front to back after using the toilet.  Keep all follow-up visits as told by your health care provider. This is important. How is this prevented?  Do not douche.  Wash the outside of your vagina with warm water only.  Use protection when having sex. This includes latex condoms and dental dams.  Limit how many sexual partners you have. To help prevent bacterial vaginosis, it is best to have sex with just   one partner (monogamous).  Make sure you and your sexual partner are tested for STIs.  Wear cotton or cotton-lined underwear.  Avoid wearing tight pants and pantyhose, especially during summer.  Limit the amount of alcohol that you drink.  Do not use any products that  contain nicotine or tobacco, such as cigarettes and e-cigarettes. If you need help quitting, ask your health care provider.  Do not use illegal drugs. Where to find more information:  Centers for Disease Control and Prevention: www.cdc.gov/std  American Sexual Health Association (ASHA): www.ashastd.org  U.S. Department of Health and Human Services, Office on Women's Health: www.womenshealth.gov/ or https://www.womenshealth.gov/a-z-topics/bacterial-vaginosis Contact a health care provider if:  Your symptoms do not improve, even after treatment.  You have more discharge or pain when urinating.  You have a fever.  You have pain in your abdomen.  You have pain during sex.  You have vaginal bleeding between periods. Summary  Bacterial vaginosis is a vaginal infection that occurs when the normal balance of bacteria in the vagina is disrupted.  Because bacterial vaginosis increases your risk for STIs (sexually transmitted infections), getting treated can help reduce your risk for chlamydia, gonorrhea, herpes, and HIV (human immunodeficiency virus). Treatment is also important for preventing complications in pregnant women, because the condition can cause an early (premature) delivery.  This condition is treated with antibiotic medicines. These may be given as a pill, a vaginal cream, or a medicine that is put into the vagina (suppository). This information is not intended to replace advice given to you by your health care provider. Make sure you discuss any questions you have with your health care provider. Document Released: 02/05/2005 Document Revised: 10/22/2015 Document Reviewed: 10/22/2015 Elsevier Interactive Patient Education  2017 Else[employer] Inc.  

## 2016-04-21 ENCOUNTER — Encounter (HOSPITAL_COMMUNITY): Payer: Self-pay | Admitting: Family Medicine

## 2016-04-21 ENCOUNTER — Ambulatory Visit (HOSPITAL_COMMUNITY)
Admission: EM | Admit: 2016-04-21 | Discharge: 2016-04-21 | Disposition: A | Discharge status: Home / Self Care | Payer: BLUE CROSS/BLUE SHIELD | Attending: Internal Medicine | Admitting: Internal Medicine

## 2016-04-21 DIAGNOSIS — S161XXA Strain of muscle, fascia and tendon at neck level, initial encounter: Secondary | ICD-10-CM | POA: Diagnosis not present

## 2016-04-21 DIAGNOSIS — S46819A Strain of other muscles, fascia and tendons at shoulder and upper arm level, unspecified arm, initial encounter: Secondary | ICD-10-CM

## 2016-04-21 DIAGNOSIS — M549 Dorsalgia, unspecified: Secondary | ICD-10-CM

## 2016-04-21 MED ORDER — KETOROLAC TROMETHAMINE 60 MG/2ML IM SOLN
60.0000 mg | Freq: Once | INTRAMUSCULAR | Status: AC
  Administered 2016-04-21: 60 mg via INTRAMUSCULAR

## 2016-04-21 MED ORDER — NAPROXEN 375 MG PO TABS: 375.0000 mg | ORAL_TABLET | Freq: Two times a day (BID) | ORAL | 0 refills | Status: DC

## 2016-04-21 MED ORDER — METHOCARBAMOL 500 MG PO TABS: 500.0000 mg | ORAL_TABLET | Freq: Two times a day (BID) | ORAL | 0 refills | Status: DC

## 2016-04-21 MED ORDER — HYDROCODONE-ACETAMINOPHEN 5-325 MG PO TABS: 1.0000 | ORAL_TABLET | ORAL | 0 refills | Status: DC | PRN

## 2016-04-21 MED ORDER — KETOROLAC TROMETHAMINE 60 MG/2ML IM SOLN
INTRAMUSCULAR | Status: AC
  Filled 2016-04-21: qty 2
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
## 2016-04-21 NOTE — Discharge Instructions (Signed)
Read instructions accompanying these papers for care of your muscle pain. Some of the instructions say start with ice. Recommended to use heat initially. If for some reason he is not helping than you can try ice. Wear the cervical collar for the next 2-3 days to keep the muscles relaxed and from having to work and hold her head up. You may remove it periodically and slowly start to move her head around to loosen the muscles and start stretching. If you develop worsening pain, tingling or weakness in one arm or other new symptoms seek medical attention promptly.
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
## 2016-04-24 ENCOUNTER — Encounter: Payer: Self-pay | Admitting: Primary Care

## 2016-04-24 ENCOUNTER — Ambulatory Visit: Payer: 59 | Admitting: Primary Care

## 2016-04-24 ENCOUNTER — Ambulatory Visit (INDEPENDENT_AMBULATORY_CARE_PROVIDER_SITE_OTHER): Payer: BLUE CROSS/BLUE SHIELD | Admitting: Primary Care

## 2016-04-24 VITALS — BP 124/84 | HR 110 | Temp 97.7°F | Ht 62.0 in | Wt 206.1 lb

## 2016-04-24 DIAGNOSIS — M549 Dorsalgia, unspecified: Secondary | ICD-10-CM | POA: Diagnosis not present

## 2016-04-24 MED ORDER — NAPROXEN 500 MG PO TABS: 500.0000 mg | ORAL_TABLET | Freq: Two times a day (BID) | ORAL | 0 refills | Status: DC | PRN

## 2016-04-24 MED ORDER — METHOCARBAMOL 500 MG PO TABS: 500.0000 mg | ORAL_TABLET | Freq: Three times a day (TID) | ORAL | 0 refills | Status: DC | PRN
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
## 2016-04-24 NOTE — Patient Instructions (Signed)
Continue use of the methocarbamol to help relax the muscles, continue use of the naproxen as needed for pain.  Please notify me if you notice increased numbness/tingling by Friday this week.  You should be feeling better by Monday next week, sometimes it takes a while for the muscles to relax.   Refrain from sitting too long as this will increase stiffness.  Please keep me updated. It was a pleasure to see you today!

## 2016-04-24 NOTE — Progress Notes (Signed)
Pre visit review using our clinic review tool, if applicable. No additional management support is needed unless otherwise documented below in the visit note. 

## 2016-04-30 ENCOUNTER — Encounter: Payer: Self-pay | Admitting: Primary Care

## 2016-05-01 ENCOUNTER — Other Ambulatory Visit: Payer: Self-pay | Admitting: Primary Care

## 2016-05-01 DIAGNOSIS — M549 Dorsalgia, unspecified: Secondary | ICD-10-CM

## 2016-05-01 DIAGNOSIS — M542 Cervicalgia: Secondary | ICD-10-CM

## 2016-05-01 MED ORDER — METHOCARBAMOL 500 MG PO TABS: 500.0000 mg | ORAL_TABLET | Freq: Four times a day (QID) | ORAL | 0 refills | Status: DC | PRN

## 2016-05-01 MED ORDER — MELOXICAM 15 MG PO TABS: 15.0000 mg | ORAL_TABLET | Freq: Every day | ORAL | 0 refills | Status: DC | PRN
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
## 2016-05-01 NOTE — Telephone Encounter (Signed)
Ok to refill? Electronically refill request for methocarbamol (ROBAXIN) 500 MG tablet. Last prescribed and seen on 04/24/2016.

## 2016-05-09 ENCOUNTER — Other Ambulatory Visit: Payer: Self-pay | Admitting: Obstetrics & Gynecology

## 2016-05-09 DIAGNOSIS — M25512 Pain in left shoulder: Secondary | ICD-10-CM | POA: Diagnosis not present

## 2016-05-09 DIAGNOSIS — M25511 Pain in right shoulder: Secondary | ICD-10-CM | POA: Diagnosis not present

## 2016-05-09 DIAGNOSIS — M5000 Cervical disc disorder with myelopathy, unspecified cervical region: Secondary | ICD-10-CM | POA: Diagnosis not present

## 2016-05-09 DIAGNOSIS — S335XXA Sprain of ligaments of lumbar spine, initial encounter: Secondary | ICD-10-CM | POA: Diagnosis not present

## 2016-05-11 DIAGNOSIS — M542 Cervicalgia: Secondary | ICD-10-CM | POA: Diagnosis not present

## 2016-05-11 DIAGNOSIS — M5412 Radiculopathy, cervical region: Secondary | ICD-10-CM | POA: Diagnosis not present

## 2016-05-11 DIAGNOSIS — M545 Low back pain: Secondary | ICD-10-CM | POA: Diagnosis not present

## 2016-05-12 DIAGNOSIS — M542 Cervicalgia: Secondary | ICD-10-CM | POA: Diagnosis not present

## 2016-05-14 ENCOUNTER — Ambulatory Visit (INDEPENDENT_AMBULATORY_CARE_PROVIDER_SITE_OTHER): Payer: BLUE CROSS/BLUE SHIELD | Admitting: Adult Health

## 2016-05-14 ENCOUNTER — Encounter: Payer: Self-pay | Admitting: Adult Health

## 2016-05-14 VITALS — BP 106/60 | HR 96 | Ht 62.0 in | Wt 205.0 lb

## 2016-05-14 DIAGNOSIS — Z6837 Body mass index (BMI) 37.0-37.9, adult: Secondary | ICD-10-CM | POA: Diagnosis not present

## 2016-05-14 DIAGNOSIS — N926 Irregular menstruation, unspecified: Secondary | ICD-10-CM | POA: Diagnosis not present

## 2016-05-14 DIAGNOSIS — Z713 Dietary counseling and surveillance: Secondary | ICD-10-CM

## 2016-05-14 MED ORDER — PHENTERMINE HCL 37.5 MG PO TABS: 37.5000 mg | ORAL_TABLET | Freq: Every day | ORAL | 0 refills | Status: DC

## 2016-05-14 NOTE — Patient Instructions (Signed)
Continue weight loss efforts Follow up in 4 weeks 
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
## 2016-05-17 DIAGNOSIS — M5412 Radiculopathy, cervical region: Secondary | ICD-10-CM | POA: Diagnosis not present

## 2016-05-17 DIAGNOSIS — M542 Cervicalgia: Secondary | ICD-10-CM | POA: Diagnosis not present

## 2016-05-17 DIAGNOSIS — M545 Low back pain: Secondary | ICD-10-CM | POA: Diagnosis not present

## 2016-05-31 DIAGNOSIS — M545 Low back pain: Secondary | ICD-10-CM | POA: Diagnosis not present

## 2016-05-31 DIAGNOSIS — M542 Cervicalgia: Secondary | ICD-10-CM | POA: Diagnosis not present

## 2016-06-11 ENCOUNTER — Ambulatory Visit (INDEPENDENT_AMBULATORY_CARE_PROVIDER_SITE_OTHER): Payer: BLUE CROSS/BLUE SHIELD | Admitting: Adult Health

## 2016-06-11 ENCOUNTER — Encounter: Payer: Self-pay | Admitting: Adult Health

## 2016-06-11 ENCOUNTER — Ambulatory Visit: Payer: BLUE CROSS/BLUE SHIELD | Admitting: Adult Health

## 2016-06-11 VITALS — BP 110/60 | HR 100 | Ht 62.0 in | Wt 204.5 lb

## 2016-06-11 DIAGNOSIS — Z713 Dietary counseling and surveillance: Secondary | ICD-10-CM

## 2016-06-11 DIAGNOSIS — Z6837 Body mass index (BMI) 37.0-37.9, adult: Secondary | ICD-10-CM | POA: Diagnosis not present

## 2016-06-11 MED ORDER — PHENTERMINE HCL 37.5 MG PO TABS: 37.5000 mg | ORAL_TABLET | Freq: Every day | ORAL | 0 refills | Status: DC
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
## 2016-06-21 DIAGNOSIS — M5412 Radiculopathy, cervical region: Secondary | ICD-10-CM | POA: Diagnosis not present

## 2016-06-21 DIAGNOSIS — M545 Low back pain: Secondary | ICD-10-CM | POA: Diagnosis not present

## 2016-06-21 DIAGNOSIS — M542 Cervicalgia: Secondary | ICD-10-CM | POA: Diagnosis not present

## 2016-06-29 DIAGNOSIS — M545 Low back pain: Secondary | ICD-10-CM | POA: Diagnosis not present

## 2016-07-09 ENCOUNTER — Ambulatory Visit: Payer: BLUE CROSS/BLUE SHIELD | Admitting: Adult Health

## 2016-07-10 ENCOUNTER — Telehealth: Payer: Self-pay | Admitting: *Deleted

## 2016-07-10 NOTE — Telephone Encounter (Signed)
LMOVM that phenterimine was approved for 84days/year and she has already used 60 days. The pharmacy can now only give her 24 more tablets for the year. Advised she can always ask for cash price at the pharmacy.

## 2016-07-18 DIAGNOSIS — M545 Low back pain: Secondary | ICD-10-CM | POA: Diagnosis not present

## 2016-07-18 DIAGNOSIS — M5412 Radiculopathy, cervical region: Secondary | ICD-10-CM | POA: Diagnosis not present

## 2016-07-27 DIAGNOSIS — M545 Low back pain: Secondary | ICD-10-CM | POA: Diagnosis not present

## 2016-07-31 DIAGNOSIS — M542 Cervicalgia: Secondary | ICD-10-CM | POA: Diagnosis not present

## 2016-07-31 DIAGNOSIS — M791 Myalgia: Secondary | ICD-10-CM | POA: Diagnosis not present

## 2016-07-31 DIAGNOSIS — M545 Low back pain: Secondary | ICD-10-CM | POA: Diagnosis not present

## 2016-10-02 ENCOUNTER — Other Ambulatory Visit: Payer: Self-pay | Admitting: Adult Health

## 2016-11-07 DIAGNOSIS — M545 Low back pain: Secondary | ICD-10-CM | POA: Diagnosis not present

## 2017-01-01 ENCOUNTER — Encounter: Payer: Self-pay | Admitting: Adult Health

## 2017-01-01 ENCOUNTER — Other Ambulatory Visit: Payer: Self-pay

## 2017-01-01 ENCOUNTER — Ambulatory Visit (INDEPENDENT_AMBULATORY_CARE_PROVIDER_SITE_OTHER): Payer: BLUE CROSS/BLUE SHIELD | Admitting: Adult Health

## 2017-01-01 VITALS — BP 102/62 | HR 107 | Resp 20 | Ht 62.0 in | Wt 219.0 lb

## 2017-01-01 DIAGNOSIS — Z01419 Encounter for gynecological examination (general) (routine) without abnormal findings: Secondary | ICD-10-CM | POA: Diagnosis not present

## 2017-01-01 DIAGNOSIS — E559 Vitamin D deficiency, unspecified: Secondary | ICD-10-CM

## 2017-01-01 DIAGNOSIS — Z131 Encounter for screening for diabetes mellitus: Secondary | ICD-10-CM | POA: Diagnosis not present

## 2017-01-01 DIAGNOSIS — Z3041 Encounter for surveillance of contraceptive pills: Secondary | ICD-10-CM

## 2017-01-01 MED ORDER — BUTALBITAL-APAP-CAFFEINE 50-300-40 MG PO CAPS: ORAL_CAPSULE | ORAL | 1 refills | Status: DC

## 2017-01-01 MED ORDER — NORETHINDRONE 0.35 MG PO TABS: 1.0000 | ORAL_TABLET | Freq: Every day | ORAL | 12 refills | Status: DC

## 2017-01-01 NOTE — Patient Instructions (Signed)
Physical in 1 year 

## 2017-01-01 NOTE — Progress Notes (Addendum)
Patient ID: Artelia LarocheRoberta M Aldape, female   DOB: 07-Jun-1980, 36 y.o.   MRN: [id number] History of Present Illness: Jenel LucksRoberta is a 36 year old black female, in for a well woman gyn exam, she had a normal pap with negative HPV 12/26/15. PCP is Dr Chestine Sporelark in [place].   Current Medications, Allergies, Past Medical History, Past Surgical History, Family History and Social History were reviewed in [facility] electronic medical record.     Review of Systems: Patient denies any  hearing loss, fatigue, blurred vision, shortness of breath, chest pain, abdominal pain, problems with bowel movements, urination, or intercourse. No joint pain or mood swings.+body aches,having some headaches, used to have migraines,She takes vitamins from [employer].     Physical Exam:BP 102/62 (BP Location: Right Arm, Patient Position: Sitting, Cuff Size: Large)   Pulse (!) 107   Resp 20   Ht 5\' 2"  (1.575 m)   Wt 219 lb (99.3 kg)   LMP 12/26/2016 (Exact Date)   BMI 40.06 kg/m  General:  Well developed, well nourished, no acute distress Skin:  Warm and dry Neck:  Midline trachea, normal thyroid, good ROM, no lymphadenopathy Lungs; Clear to auscultation bilaterally Breast:  No dominant palpable mass, retraction, or nipple discharge Cardiovascular: Regular rate and rhythm Abdomen:  Soft, non tender, no hepatosplenomegaly Pelvic:  External genitalia is normal in appearance, no lesions.  The vagina is normal in appearance. Urethra has no lesions or masses. The cervix is bulbous.  Uterus is felt to be normal size, shape, and contour.  No adnexal masses or tenderness noted.Bladder is non tender, no masses felt. Extremities/musculoskeletal:  No swelling or varicosities noted, no clubbing or cyanosis Psych:  No mood changes, alert and cooperative,seems happy PHQ 2 score 0.  Impression: 1. Encounter for well woman exam with routine gynecological exam   2. Encounter for surveillance of contraceptive pills   3. Vitamin D  deficiency   4. Screening for diabetes mellitus       Plan:  Refilled micronor x 1 year Rx Fioricet #30 take 1 every 6 hours prn headache with 1 refills  Check CBC,CMP,TSH and lipids,A1c and vitamin D and HIV Physical in 1 year Pap in 2020 Mammogram at 39 Talk with orthopedic doctor about maybe getting massage

## 2017-01-02 LAB — HIV ANTIBODY (ROUTINE TESTING W REFLEX): HIV Screen 4th Generation wRfx: NONREACTIVE

## 2017-01-02 LAB — LIPID PANEL
Chol/HDL Ratio: 2.2 ratio (ref 0.0–4.4)
Cholesterol, Total: 100 mg/dL (ref 100–199)
HDL: 46 mg/dL (ref >39)
LDL Calculated: 44 mg/dL (ref 0–99)
Triglycerides: 48 mg/dL (ref 0–149)
VLDL Cholesterol Cal: 10 mg/dL (ref 5–40)

## 2017-01-02 LAB — CBC
Hematocrit: 37 % (ref 34.0–46.6)
Hemoglobin: 12.3 g/dL (ref 11.1–15.9)
MCH: 30 pg (ref 26.6–33.0)
MCHC: 33.2 g/dL (ref 31.5–35.7)
MCV: 90 fL (ref 79–97)
Platelets: 306 10*3/uL (ref 150–379)
RBC: 4.1 x10E6/uL (ref 3.77–5.28)
RDW: 13.8 % (ref 12.3–15.4)
WBC: 6.1 10*3/uL (ref 3.4–10.8)

## 2017-01-02 LAB — HEMOGLOBIN A1C
Est. average glucose Bld gHb Est-mCnc: 111 mg/dL
Hgb A1c MFr Bld: 5.5 % (ref 4.8–5.6)

## 2017-01-02 LAB — TSH: TSH: 1.18 u[IU]/mL (ref 0.450–4.500)

## 2017-01-02 LAB — COMPREHENSIVE METABOLIC PANEL
ALT: 18 IU/L (ref 0–32)
AST: 16 IU/L (ref 0–40)
Albumin/Globulin Ratio: 1.6 (ref 1.2–2.2)
Albumin: 4 g/dL (ref 3.5–5.5)
Alkaline Phosphatase: 78 IU/L (ref 39–117)
BUN/Creatinine Ratio: 15 (ref 9–23)
BUN: 14 mg/dL (ref 6–20)
Bilirubin Total: 0.2 mg/dL (ref 0.0–1.2)
CO2: 23 mmol/L (ref 20–29)
Calcium: 8.7 mg/dL (ref 8.7–10.2)
Chloride: 105 mmol/L (ref 96–106)
Creatinine, Ser: 0.92 mg/dL (ref 0.57–1.00)
GFR calc Af Amer: 92 mL/min/{1.73_m2} (ref >59)
GFR calc non Af Amer: 80 mL/min/{1.73_m2} (ref >59)
Globulin, Total: 2.5 g/dL (ref 1.5–4.5)
Glucose: 105 mg/dL — ABNORMAL HIGH (ref 65–99)
Potassium: 4.4 mmol/L (ref 3.5–5.2)
Sodium: 140 mmol/L (ref 134–144)
Total Protein: 6.5 g/dL (ref 6.0–8.5)

## 2017-01-02 LAB — VITAMIN D 25 HYDROXY (VIT D DEFICIENCY, FRACTURES): Vit D, 25-Hydroxy: 37.1 ng/mL (ref 30.0–100.0)

## 2017-02-25 ENCOUNTER — Telehealth: Payer: Self-pay | Admitting: *Deleted

## 2017-02-25 NOTE — Telephone Encounter (Signed)
Spoke to patient regarding her concerns. She reports that she took her fioricet for the first time yesterday. She took two tablets. Later she broke out in hives all over her body. She has no facial swelling or difficulty breathing. Advised that she can take benadryl to help with the reaction, and to not take any more fioricet. Pt is agreeable and wanted to know if there is any other medication she can have to use for headaches. Advised that I would route a message to Victorino DikeJennifer to see if there are any other options for her.

## 2017-02-25 NOTE — Telephone Encounter (Signed)
LEft message to not take any more Fioricet, try Excedrin migraine

## 2017-04-18 DIAGNOSIS — M542 Cervicalgia: Secondary | ICD-10-CM | POA: Diagnosis not present

## 2017-04-18 DIAGNOSIS — M545 Low back pain: Secondary | ICD-10-CM | POA: Diagnosis not present

## 2017-04-18 DIAGNOSIS — M7541 Impingement syndrome of right shoulder: Secondary | ICD-10-CM | POA: Diagnosis not present

## 2017-05-01 DIAGNOSIS — M25511 Pain in right shoulder: Secondary | ICD-10-CM | POA: Diagnosis not present

## 2017-05-01 DIAGNOSIS — M545 Low back pain: Secondary | ICD-10-CM | POA: Diagnosis not present

## 2017-05-01 DIAGNOSIS — M542 Cervicalgia: Secondary | ICD-10-CM | POA: Diagnosis not present

## 2017-05-31 IMAGING — CR DG KNEE AP/LAT W/ SUNRISE*L*
4 series · 4 of 4 positions shown · non-contrast
Comparison: No recent prior .

CLINICAL DATA: Chronic knee pain.  No reported injury.

EXAM:
LEFT KNEE 3 VIEWS

[view not recorded (1 of 4)]
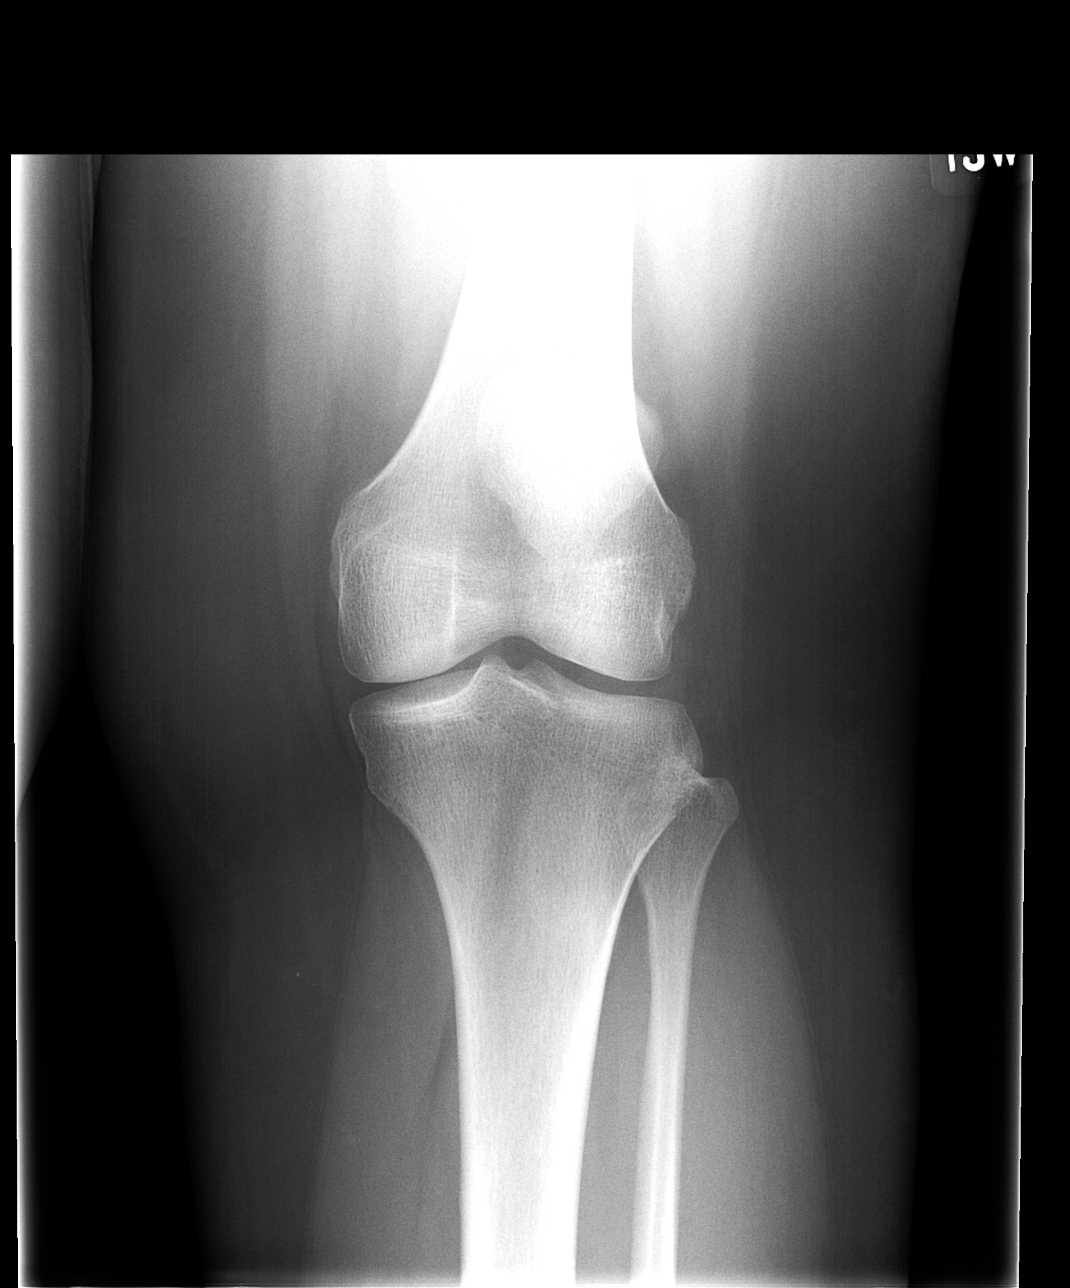

[view not recorded (2 of 4)]
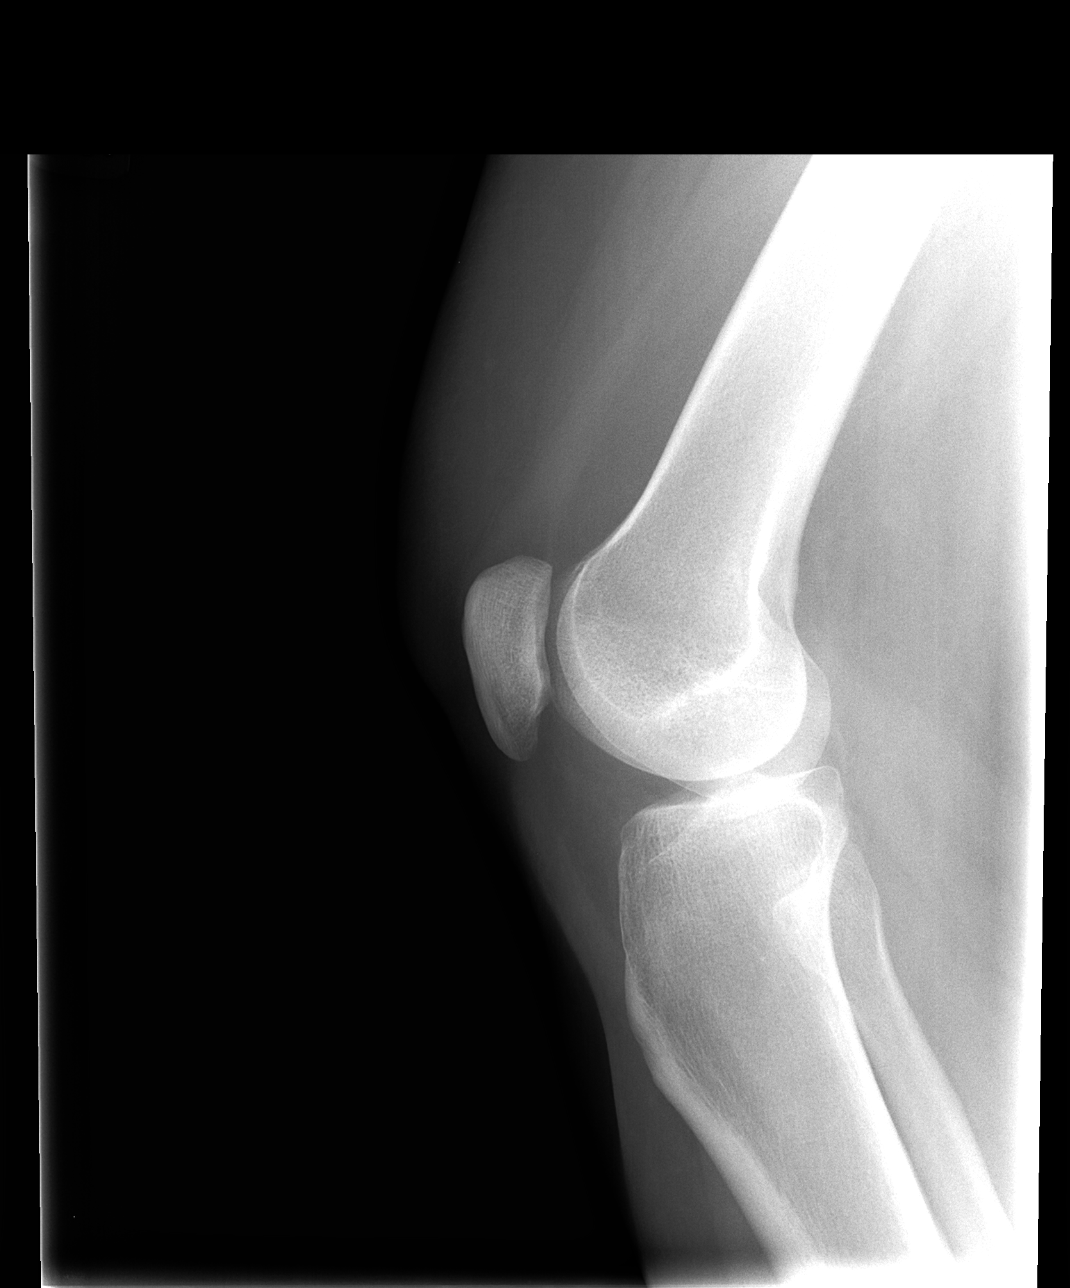

[view not recorded (3 of 4)]
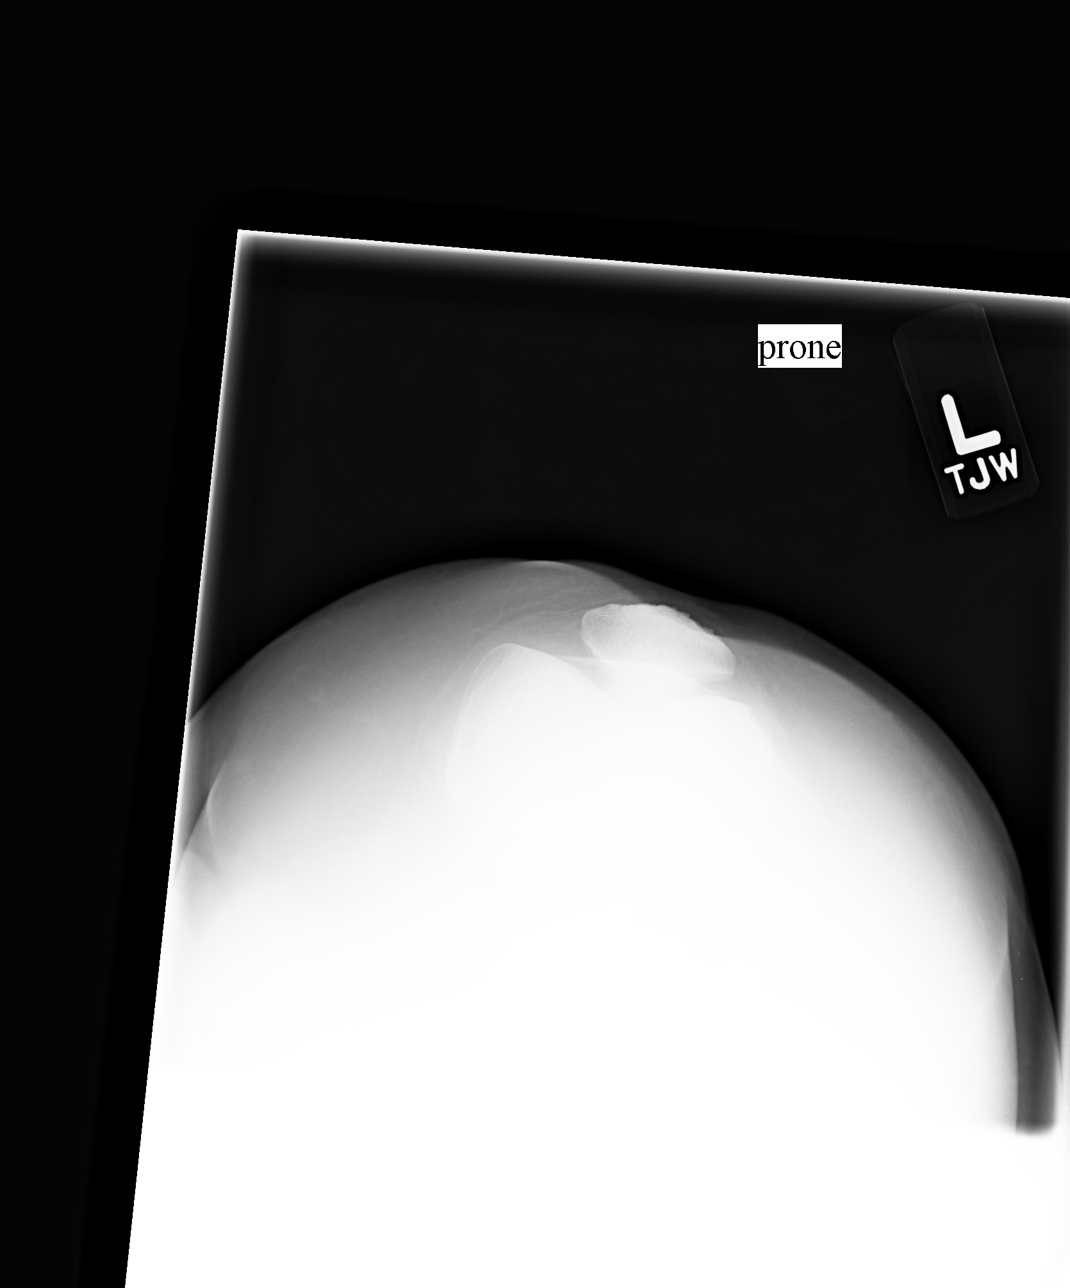

[view not recorded (4 of 4)]
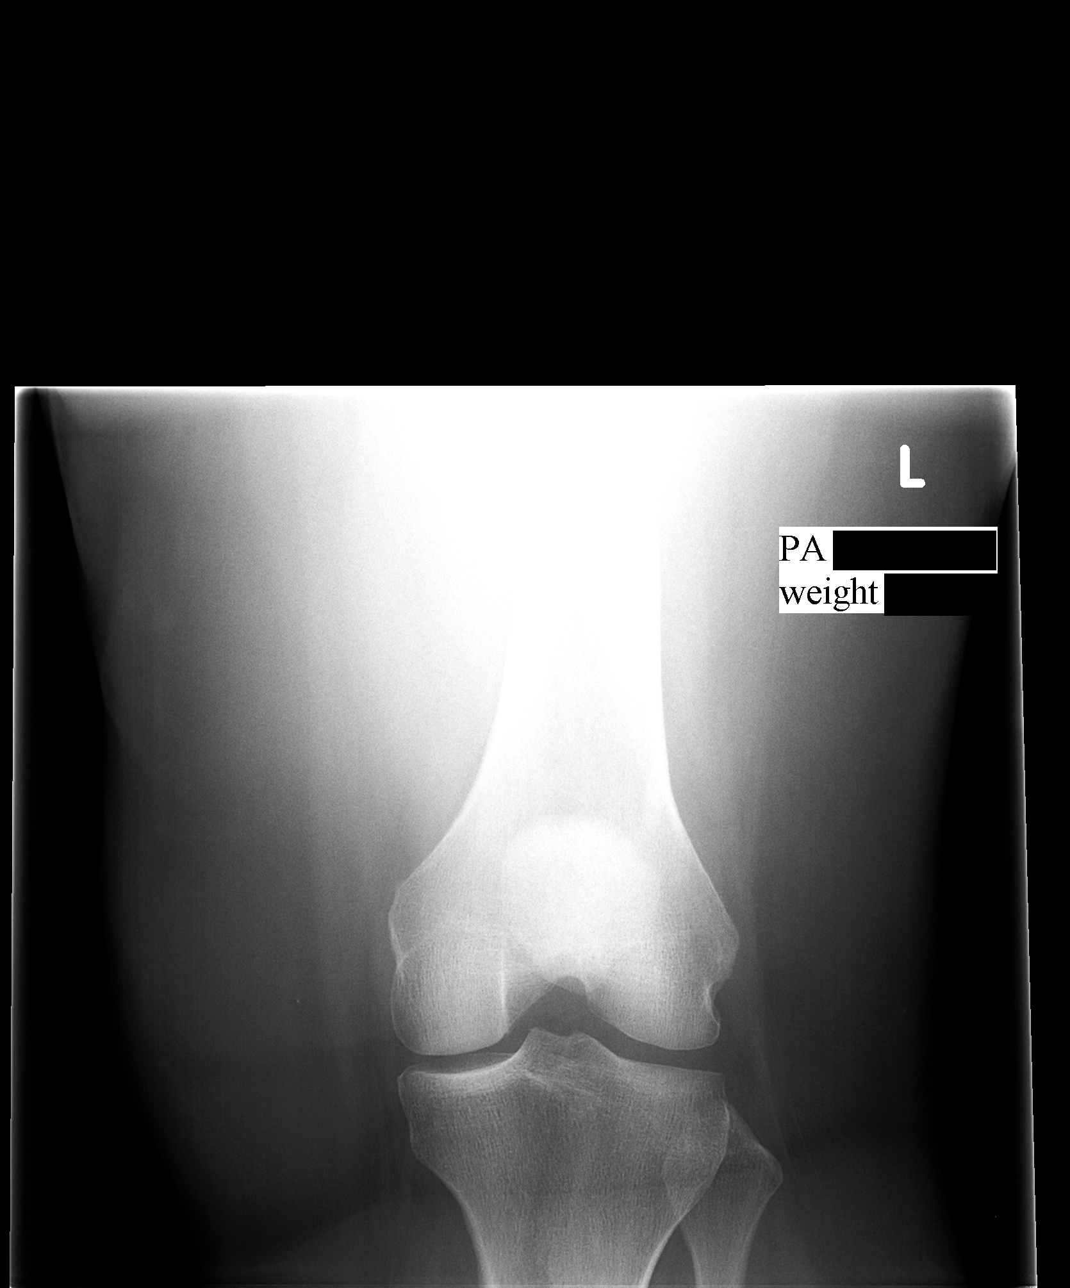

[4 of 4 positions shown; findings below may reference images not displayed]

FINDINGS: There is no evidence of fracture, dislocation, or joint effusion.
There is no evidence of arthropathy or other focal bone abnormality.
Soft tissues are unremarkable.
IMPRESSION: Negative.

## 2017-10-10 DIAGNOSIS — M791 Myalgia, unspecified site: Secondary | ICD-10-CM | POA: Diagnosis not present

## 2017-10-10 DIAGNOSIS — M542 Cervicalgia: Secondary | ICD-10-CM | POA: Diagnosis not present

## 2017-10-10 DIAGNOSIS — M545 Low back pain: Secondary | ICD-10-CM | POA: Diagnosis not present

## 2017-10-10 DIAGNOSIS — M7541 Impingement syndrome of right shoulder: Secondary | ICD-10-CM | POA: Diagnosis not present

## 2018-01-23 ENCOUNTER — Encounter: Payer: Self-pay | Admitting: Adult Health

## 2018-01-23 ENCOUNTER — Ambulatory Visit (INDEPENDENT_AMBULATORY_CARE_PROVIDER_SITE_OTHER): Payer: BLUE CROSS/BLUE SHIELD | Admitting: Adult Health

## 2018-01-23 VITALS — BP 130/90 | HR 95 | Ht 62.0 in | Wt 217.0 lb

## 2018-01-23 DIAGNOSIS — N898 Other specified noninflammatory disorders of vagina: Secondary | ICD-10-CM

## 2018-01-23 DIAGNOSIS — E559 Vitamin D deficiency, unspecified: Secondary | ICD-10-CM

## 2018-01-23 DIAGNOSIS — Z01419 Encounter for gynecological examination (general) (routine) without abnormal findings: Secondary | ICD-10-CM | POA: Diagnosis not present

## 2018-01-23 DIAGNOSIS — Z3041 Encounter for surveillance of contraceptive pills: Secondary | ICD-10-CM

## 2018-01-23 DIAGNOSIS — Z0000 Encounter for general adult medical examination without abnormal findings: Secondary | ICD-10-CM | POA: Diagnosis not present

## 2018-01-23 LAB — POCT WET PREP (WET MOUNT)
Clue Cells Wet Prep Whiff POC: NEGATIVE
Trichomonas Wet Prep HPF POC: ABSENT

## 2018-01-23 MED ORDER — NORETHINDRONE 0.35 MG PO TABS: 1.0000 | ORAL_TABLET | Freq: Every day | ORAL | 12 refills | Status: DC

## 2018-01-23 NOTE — Progress Notes (Signed)
Patient ID: Artelia LarocheRoberta M Czerwinski, female   DOB: 1980-07-19, 37 y.o.   MRN: [id number] History of Present Illness: Jenel LucksRoberta is a 37 year old black female, [id number], in for well woman gyn exam, she had a negative pap with negative HPV 12/26/15. PCP is Vernona RiegerKatherine Clark NP.   Current Medications, Allergies, Past Medical History, Past Surgical History, Family History and Social History were reviewed in [facility] electronic medical record.     Review of Systems: Patient denies any daily headaches, hearing loss, fatigue, blurred vision, shortness of breath, chest pain, abdominal pain, problems with bowel movements, urination, or intercourse. No joint pain or mood swings. +Vaginal discharge with slight odor at times,no itching  Sees orthopedist about twice a year for injection in right shoulder.     Physical Exam:BP 130/90 (BP Location: Right Arm, Patient Position: Sitting, Cuff Size: Large)   Pulse 95   Ht 5\' 2"  (1.575 m)   Wt 217 lb (98.4 kg)   LMP 01/03/2018   BMI 39.69 kg/m  General:  Well developed, well nourished, no acute distress Skin:  Warm and dry Neck:  Midline trachea, normal thyroid, good ROM, no lymphadenopathy Lungs; Clear to auscultation bilaterally Breast:  No dominant palpable mass, retraction, or nipple discharge Cardiovascular: Regular rate and rhythm Abdomen:  Soft, non tender, no hepatosplenomegaly Pelvic:  External genitalia is normal in appearance, no lesions.  The vagina is normal in appearance.scant discharge with no odor. Urethra has no lesions or masses. The cervix is bulbous.  Uterus is felt to be normal size, shape, and contour.  No adnexal masses or tenderness noted.Bladder is non tender, no masses felt. Wet prep was negative. Extremities/musculoskeletal:  No swelling or varicosities noted, no clubbing or cyanosis Psych:  No mood changes, alert and cooperative,seems happy PHQ 9 score 7, no SI. Fall risk low. Examination chaperoned by Federico FlakePeggy Dones CMA. She  requests labs.  Impression: 1. Encounter for well woman exam with routine gynecological exam   2. Encounter for surveillance of contraceptive pills   3. Vaginal discharge   4. Vitamin D deficiency       Plan: Check CBC,CMP,TSH and lipids.vitamin D Meds ordered this encounter  Medications  . norethindrone (MICRONOR,CAMILA,ERRIN) 0.35 MG tablet    Sig: Take 1 tablet (0.35 mg total) by mouth daily.    Dispense:  28 tablet    Refill:  12    Order Specific Question:   Supervising Provider    Answer:   Duane LopeEURE, LUTHER H [[id number]]  Pap and physical in 1 year Mammogram at 39

## 2018-01-24 LAB — COMPREHENSIVE METABOLIC PANEL
ALT: 10 IU/L (ref 0–32)
AST: 10 IU/L (ref 0–40)
Albumin/Globulin Ratio: 1.6 (ref 1.2–2.2)
Albumin: 4.1 g/dL (ref 3.5–5.5)
Alkaline Phosphatase: 95 IU/L (ref 39–117)
BUN/Creatinine Ratio: 15 (ref 9–23)
BUN: 12 mg/dL (ref 6–20)
Bilirubin Total: 0.2 mg/dL (ref 0.0–1.2)
CO2: 23 mmol/L (ref 20–29)
Calcium: 8.8 mg/dL (ref 8.7–10.2)
Chloride: 102 mmol/L (ref 96–106)
Creatinine, Ser: 0.82 mg/dL (ref 0.57–1.00)
GFR calc Af Amer: 105 mL/min/{1.73_m2} (ref >59)
GFR calc non Af Amer: 91 mL/min/{1.73_m2} (ref >59)
Globulin, Total: 2.5 g/dL (ref 1.5–4.5)
Glucose: 91 mg/dL (ref 65–99)
Potassium: 4.4 mmol/L (ref 3.5–5.2)
Sodium: 138 mmol/L (ref 134–144)
Total Protein: 6.6 g/dL (ref 6.0–8.5)

## 2018-01-24 LAB — LIPID PANEL
Chol/HDL Ratio: 2.5 ratio (ref 0.0–4.4)
Cholesterol, Total: 105 mg/dL (ref 100–199)
HDL: 42 mg/dL (ref >39)
LDL Calculated: 52 mg/dL (ref 0–99)
Triglycerides: 57 mg/dL (ref 0–149)
VLDL Cholesterol Cal: 11 mg/dL (ref 5–40)

## 2018-01-24 LAB — CBC
Hematocrit: 39.3 % (ref 34.0–46.6)
Hemoglobin: 13 g/dL (ref 11.1–15.9)
MCH: 29.6 pg (ref 26.6–33.0)
MCHC: 33.1 g/dL (ref 31.5–35.7)
MCV: 90 fL (ref 79–97)
Platelets: 340 10*3/uL (ref 150–450)
RBC: 4.39 x10E6/uL (ref 3.77–5.28)
RDW: 12.7 % (ref 12.3–15.4)
WBC: 5.9 10*3/uL (ref 3.4–10.8)

## 2018-01-24 LAB — VITAMIN D 25 HYDROXY (VIT D DEFICIENCY, FRACTURES): Vit D, 25-Hydroxy: 35.6 ng/mL (ref 30.0–100.0)

## 2018-01-24 LAB — TSH: TSH: 1.2 u[IU]/mL (ref 0.450–4.500)

## 2018-04-17 DIAGNOSIS — M545 Low back pain: Secondary | ICD-10-CM | POA: Diagnosis not present

## 2018-04-17 DIAGNOSIS — M67911 Unspecified disorder of synovium and tendon, right shoulder: Secondary | ICD-10-CM | POA: Diagnosis not present

## 2018-04-17 DIAGNOSIS — M542 Cervicalgia: Secondary | ICD-10-CM | POA: Diagnosis not present

## 2019-01-04 ENCOUNTER — Other Ambulatory Visit: Payer: Self-pay | Admitting: Adult Health

## 2019-01-27 DIAGNOSIS — Z20828 Contact with and (suspected) exposure to other viral communicable diseases: Secondary | ICD-10-CM | POA: Diagnosis not present

## 2019-01-27 DIAGNOSIS — Z6838 Body mass index (BMI) 38.0-38.9, adult: Secondary | ICD-10-CM | POA: Diagnosis not present

## 2019-01-28 ENCOUNTER — Encounter: Payer: Self-pay | Admitting: Adult Health

## 2019-01-28 ENCOUNTER — Other Ambulatory Visit: Payer: Self-pay

## 2019-01-28 ENCOUNTER — Ambulatory Visit (INDEPENDENT_AMBULATORY_CARE_PROVIDER_SITE_OTHER): Payer: BC Managed Care – PPO | Admitting: Adult Health

## 2019-01-28 ENCOUNTER — Other Ambulatory Visit (HOSPITAL_COMMUNITY)
Admission: RE | Admit: 2019-01-28 | Discharge: 2019-01-28 | Disposition: A | Discharge status: Home / Self Care | Payer: BC Managed Care – PPO | Source: Ambulatory Visit | Attending: Adult Health | Admitting: Adult Health

## 2019-01-28 VITALS — BP 110/73 | HR 84 | Ht 62.0 in | Wt 215.0 lb

## 2019-01-28 DIAGNOSIS — Z01419 Encounter for gynecological examination (general) (routine) without abnormal findings: Secondary | ICD-10-CM | POA: Insufficient documentation

## 2019-01-28 DIAGNOSIS — Z3041 Encounter for surveillance of contraceptive pills: Secondary | ICD-10-CM | POA: Diagnosis not present

## 2019-01-28 DIAGNOSIS — F329 Major depressive disorder, single episode, unspecified: Secondary | ICD-10-CM | POA: Diagnosis not present

## 2019-01-28 DIAGNOSIS — F32A Depression, unspecified: Secondary | ICD-10-CM

## 2019-01-28 DIAGNOSIS — F419 Anxiety disorder, unspecified: Secondary | ICD-10-CM | POA: Diagnosis not present

## 2019-01-28 MED ORDER — NORETHINDRONE 0.35 MG PO TABS: 1.0000 | ORAL_TABLET | Freq: Every day | ORAL | 4 refills | Status: DC

## 2019-01-28 MED ORDER — BUPROPION HCL 100 MG PO TABS: 100.0000 mg | ORAL_TABLET | Freq: Two times a day (BID) | ORAL | 3 refills | Status: DC

## 2019-01-28 MED ORDER — HYDROXYZINE HCL 10 MG PO TABS: 10.0000 mg | ORAL_TABLET | Freq: Three times a day (TID) | ORAL | 2 refills | Status: DC | PRN
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
## 2019-01-29 LAB — COMPREHENSIVE METABOLIC PANEL
ALT: 10 IU/L (ref 0–32)
AST: 12 IU/L (ref 0–40)
Albumin/Globulin Ratio: 1.3 (ref 1.2–2.2)
Albumin: 4 g/dL (ref 3.8–4.8)
Alkaline Phosphatase: 99 IU/L (ref 39–117)
BUN/Creatinine Ratio: 11 (ref 9–23)
BUN: 10 mg/dL (ref 6–20)
Bilirubin Total: 0.3 mg/dL (ref 0.0–1.2)
CO2: 23 mmol/L (ref 20–29)
Calcium: 8.8 mg/dL (ref 8.7–10.2)
Chloride: 105 mmol/L (ref 96–106)
Creatinine, Ser: 0.89 mg/dL (ref 0.57–1.00)
GFR calc Af Amer: 94 mL/min/{1.73_m2} (ref >59)
GFR calc non Af Amer: 82 mL/min/{1.73_m2} (ref >59)
Globulin, Total: 3 g/dL (ref 1.5–4.5)
Glucose: 80 mg/dL (ref 65–99)
Potassium: 4.5 mmol/L (ref 3.5–5.2)
Sodium: 140 mmol/L (ref 134–144)
Total Protein: 7 g/dL (ref 6.0–8.5)

## 2019-01-29 LAB — LIPID PANEL
Chol/HDL Ratio: 2.5 ratio (ref 0.0–4.4)
Cholesterol, Total: 107 mg/dL (ref 100–199)
HDL: 43 mg/dL (ref >39)
LDL Chol Calc (NIH): 51 mg/dL (ref 0–99)
Triglycerides: 57 mg/dL (ref 0–149)
VLDL Cholesterol Cal: 13 mg/dL (ref 5–40)

## 2019-01-29 LAB — CBC
Hematocrit: 40.7 % (ref 34.0–46.6)
Hemoglobin: 13.2 g/dL (ref 11.1–15.9)
MCH: 30.6 pg (ref 26.6–33.0)
MCHC: 32.4 g/dL (ref 31.5–35.7)
MCV: 94 fL (ref 79–97)
Platelets: 328 x10E3/uL (ref 150–450)
RBC: 4.32 x10E6/uL (ref 3.77–5.28)
RDW: 12.5 % (ref 11.7–15.4)
WBC: 6.7 x10E3/uL (ref 3.4–10.8)

## 2019-01-29 LAB — TSH: TSH: 1.05 u[IU]/mL (ref 0.450–4.500)

## 2019-01-30 LAB — CYTOLOGY - PAP
Chlamydia: NEGATIVE
Comment: NEGATIVE
Comment: NEGATIVE
Comment: NORMAL
Diagnosis: NEGATIVE
High risk HPV: NEGATIVE
Neisseria Gonorrhea: NEGATIVE

## 2019-02-21 ENCOUNTER — Other Ambulatory Visit: Payer: Self-pay | Admitting: Adult Health

## 2019-03-25 ENCOUNTER — Ambulatory Visit: Payer: BC Managed Care – PPO | Admitting: Adult Health

## 2019-05-14 DIAGNOSIS — M545 Low back pain: Secondary | ICD-10-CM | POA: Diagnosis not present

## 2019-05-14 DIAGNOSIS — M25511 Pain in right shoulder: Secondary | ICD-10-CM | POA: Diagnosis not present

## 2019-06-26 ENCOUNTER — Encounter: Payer: Self-pay | Admitting: Adult Health

## 2019-06-26 ENCOUNTER — Ambulatory Visit: Payer: BC Managed Care – PPO | Admitting: Adult Health

## 2019-06-26 ENCOUNTER — Other Ambulatory Visit: Payer: Self-pay

## 2019-06-26 VITALS — BP 115/74 | HR 91 | Ht 62.0 in | Wt 223.0 lb

## 2019-06-26 DIAGNOSIS — M7989 Other specified soft tissue disorders: Secondary | ICD-10-CM | POA: Diagnosis not present

## 2019-06-26 MED ORDER — HYDROCHLOROTHIAZIDE 25 MG PO TABS: 25.0000 mg | ORAL_TABLET | Freq: Every day | ORAL | 1 refills | Status: DC

## 2019-06-26 NOTE — Progress Notes (Signed)
  Subjective:     Patient ID: [name], female   DOB: [birth year], 39 y.o.   MRN: [id number]  HPI [name] is a 39 year old black female,single, [phone] in complaining swelling in feet and ankles for 1 week, was on vacation last week but home.She does take Neurontin at hs. PCP is Vernona Rieger NP  Review of Systems Swelling in both feet and ankles for 1 week, does down in am but still swollen she says  Reviewed past medical,surgical, social and family history. Reviewed medications and allergies.     Objective:   Physical Exam BP 115/74 (BP Location: Right Arm, Patient Position: Sitting, Cuff Size: Normal)   Pulse 91   Ht 5\' 2"  (1.575 m)   Wt 223 lb (101.2 kg)   LMP 06/06/2019   BMI 40.79 kg/m  Skin warm and dry. Lungs: clear to ausculation bilaterally. Cardiovascular: regular rate and rhythm. Has 1+ pitting edema in tops of both feet, non in ankles     Assessment:    1. Swelling of both lower extremities Decrease salt and sugars  Will add hydrochlorothiazide  Meds ordered this encounter  Medications  . hydrochlorothiazide (HYDRODIURIL) 25 MG tablet    Sig: Take 1 tablet (25 mg total) by mouth daily.    Dispense:  30 tablet    Refill:  1    Order Specific Question:   Supervising Provider    Answer:   06/08/2019 [[id number]]      Plan:     Follow up in 2 weeks

## 2019-07-10 ENCOUNTER — Ambulatory Visit: Payer: BC Managed Care – PPO | Admitting: Adult Health

## 2019-07-10 ENCOUNTER — Encounter: Payer: Self-pay | Admitting: Adult Health

## 2019-07-10 VITALS — BP 108/68 | HR 88 | Ht 62.0 in | Wt 220.0 lb

## 2019-07-10 DIAGNOSIS — I498 Other specified cardiac arrhythmias: Secondary | ICD-10-CM

## 2019-07-10 DIAGNOSIS — M7989 Other specified soft tissue disorders: Secondary | ICD-10-CM | POA: Diagnosis not present

## 2019-07-10 NOTE — Progress Notes (Signed)
  Subjective:     Patient ID: [name], female   DOB: March 13, 1980, 39 y.o.   MRN: [id number]  HPI [name] is a 39 year old black female,single, [phone] back in follow up on swelling in feet and ankles and is better, but comes back and last night had heart flutters.  PCP is Abigail Rieger NP  Review of Systems Swelling better but comes back +stress, has heart flutters last night Reviewed past medical,surgical, social and family history. Reviewed medications and allergies.     Objective:   Physical Exam BP 108/68 (BP Location: Left Arm, Patient Position: Sitting, Cuff Size: Normal)   Pulse 88   Ht 5\' 2"  (1.575 m)   Wt 220 lb (99.8 kg)   BMI 40.24 kg/m  Skin warm and dry.  Lungs: clear to ausculation bilaterally. Cardiovascular: regular rate and rhythm., no swelling or pitting in feet and ankles today Has lost 3 lbs    Assessment:     1. Swelling of both lower extremities Continue to watch salt and sugar and take hydrodiuril daily or every other day   2. Periodic heart flutter Refer ti cardiologist     Plan:     Follow up prn

## 2019-07-18 ENCOUNTER — Other Ambulatory Visit: Payer: Self-pay | Admitting: Adult Health
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
## 2019-07-27 ENCOUNTER — Other Ambulatory Visit: Payer: Self-pay

## 2019-07-27 ENCOUNTER — Ambulatory Visit: Payer: BC Managed Care – PPO | Admitting: Cardiology

## 2019-07-27 ENCOUNTER — Encounter: Payer: Self-pay | Admitting: *Deleted

## 2019-07-27 VITALS — BP 125/70 | HR 88 | Temp 97.0°F | Ht 62.0 in | Wt 221.2 lb

## 2019-07-27 DIAGNOSIS — R002 Palpitations: Secondary | ICD-10-CM | POA: Diagnosis not present

## 2019-07-27 DIAGNOSIS — R600 Localized edema: Secondary | ICD-10-CM | POA: Diagnosis not present

## 2019-07-27 DIAGNOSIS — Z131 Encounter for screening for diabetes mellitus: Secondary | ICD-10-CM

## 2019-07-27 DIAGNOSIS — R0602 Shortness of breath: Secondary | ICD-10-CM

## 2019-07-27 NOTE — Patient Instructions (Signed)
Medication Instructions:  Your physician recommends that you continue on your current medications as directed. Please refer to the Current Medication list given to you today.  Lab Work: CMET, CBC, TSH, HmgA1C, BNP  If you have labs (blood work) drawn today and your tests are completely normal, you will receive your results only by: Marland Kitchen MyChart Message (if you have MyChart) OR . A paper copy in the mail If you have any lab test that is abnormal or we need to change your treatment, we will call you to review the results.   Testing/Procedures: Your physician has requested that you have an echocardiogram. Echocardiography is a painless test that uses sound waves to create images of your heart. It provides your doctor with information about the size and shape of your heart and how well your heart's chambers and valves are working. This procedure takes approximately one hour. There are no restrictions for this procedure. This will be done at our [facility] location:  [address]  Your physician has requested that you have a lower extremity venous duplex. This test is an ultrasound of the veins in the legs. It looks at venous blood flow that carries blood from the heart to the legs. Allow one hour for a Lower Venous exam.  There are no restrictions or special instructions.   ZIO XT- Long Term Monitor Instructions   Your physician has requested you wear your ZIO patch monitor 14 days.   This is a single patch monitor.  Irhythm supplies one patch monitor per enrollment.  Additional stickers are not available.   Please do not apply patch if you will be having a Nuclear Stress Test, Echocardiogram, Cardiac CT, MRI, or Chest Xray during the time frame you would be wearing the monitor. The patch cannot be worn during these tests.  You cannot remove and re-apply the ZIO XT patch monitor.   Your ZIO patch monitor will be sent USPS Priority mail from [facility] directly to  your home address. The monitor may also be mailed to a PO BOX if home delivery is not available.   It may take 3-5 days to receive your monitor after you have been enrolled.   Once you have received you monitor, please review enclosed instructions.  Your monitor has already been registered assigning a specific monitor serial # to you.   Applying the monitor   Shave hair from upper left chest.   Hold abrader disc by orange tab.  Rub abrader in 40 strokes over left upper chest as indicated in your monitor instructions.   Clean area with 4 enclosed alcohol pads .  Use all pads to assure are is cleaned thoroughly.  Let dry.   Apply patch as indicated in monitor instructions.  Patch will be place under collarbone on left side of chest with arrow pointing upward.   Rub patch adhesive wings for 2 minutes.Remove white label marked "1".  Remove white label marked "2".  Rub patch adhesive wings for 2 additional minutes.   While looking in a mirror, press and release button in center of patch.  A small green light will flash 3-4 times .  This will be your only indicator the monitor has been turned on.     Do not shower for the first 24 hours.  You may shower after the first 24 hours.   Press button if you feel a symptom. You will hear a small click.  Record Date, Time and Symptom in  the Patient Log Book.   When you are ready to remove patch, follow instructions on last 2 pages of Patient Log Book.  Stick patch monitor onto last page of Patient Log Book.   Place Patient Log Book in [place] box.  Use locking tab on box and tape box closed securely.  The [employer] has [employer] on it.  Please place in mailbox as soon as possible.  Your physician should have your test results approximately 7 days after the monitor has been mailed back to [facility].   Call [place] at [phone] if you have questions regarding your ZIO XT patch monitor.  Call them immediately if  you see an orange light blinking on your monitor.   If your monitor falls off in less than 4 days contact our Monitor department at [phone].  If your monitor becomes loose or falls off after 4 days call [employer] at [phone] for suggestions on securing your monitor.    Follow-Up: At [facility], you and your health needs are our priority.  As part of our continuing mission to provide you with exceptional heart care, we have created designated Provider Care Teams.  These Care Teams include your primary Cardiologist (physician) and Advanced Practice Providers (APPs -  Physician Assistants and Nurse Practitioners) who all work together to provide you with the care you need, when you need it.  We recommend signing up for the patient portal called "MyChart".  Sign up information is provided on this After Visit Summary.  MyChart is used to connect with patients for Virtual Visits (Telemedicine).  Patients are able to view lab/test results, encounter notes, upcoming appointments, etc.  Non-urgent messages can be sent to your provider as well.   To learn more about what you can do with MyChart, go to [email or web address].    Your next appointment:   3 month(s)  The format for your next appointment:   In Person  Provider:   Oswaldo Milian, MD

## 2019-07-27 NOTE — Progress Notes (Signed)
Patient ID: [name], female   DOB: [birth year], 39 y.o.   MRN: [id number]  Enrolled patient to have 14 day ZIO XT long term holter monitor shipped to her home.

## 2019-07-28 LAB — HEMOGLOBIN A1C
Est. average glucose Bld gHb Est-mCnc: 111 mg/dL
Hgb A1c MFr Bld: 5.5 % (ref 4.8–5.6)

## 2019-07-28 LAB — COMPREHENSIVE METABOLIC PANEL
ALT: 13 IU/L (ref 0–32)
AST: 14 IU/L (ref 0–40)
Albumin/Globulin Ratio: 1.4 (ref 1.2–2.2)
Albumin: 3.9 g/dL (ref 3.8–4.8)
Alkaline Phosphatase: 97 IU/L (ref 48–121)
BUN/Creatinine Ratio: 17 (ref 9–23)
BUN: 15 mg/dL (ref 6–24)
Bilirubin Total: 0.3 mg/dL (ref 0.0–1.2)
CO2: 25 mmol/L (ref 20–29)
Calcium: 8.9 mg/dL (ref 8.7–10.2)
Chloride: 103 mmol/L (ref 96–106)
Creatinine, Ser: 0.88 mg/dL (ref 0.57–1.00)
GFR calc Af Amer: 95 mL/min/{1.73_m2} (ref >59)
GFR calc non Af Amer: 82 mL/min/{1.73_m2} (ref >59)
Globulin, Total: 2.7 g/dL (ref 1.5–4.5)
Glucose: 76 mg/dL (ref 65–99)
Potassium: 4.8 mmol/L (ref 3.5–5.2)
Sodium: 140 mmol/L (ref 134–144)
Total Protein: 6.6 g/dL (ref 6.0–8.5)

## 2019-07-28 LAB — CBC
Hematocrit: 39.1 % (ref 34.0–46.6)
Hemoglobin: 13.3 g/dL (ref 11.1–15.9)
MCH: 32 pg (ref 26.6–33.0)
MCHC: 34 g/dL (ref 31.5–35.7)
MCV: 94 fL (ref 79–97)
Platelets: 323 10*3/uL (ref 150–450)
RBC: 4.16 x10E6/uL (ref 3.77–5.28)
RDW: 12.2 % (ref 11.7–15.4)
WBC: 5.8 10*3/uL (ref 3.4–10.8)

## 2019-07-28 LAB — TSH: TSH: 1.42 u[IU]/mL (ref 0.450–4.500)

## 2019-07-28 LAB — BRAIN NATRIURETIC PEPTIDE: BNP: 22.6 pg/mL (ref 0.0–100.0)

## 2019-07-29 ENCOUNTER — Ambulatory Visit (INDEPENDENT_AMBULATORY_CARE_PROVIDER_SITE_OTHER): Payer: BC Managed Care – PPO

## 2019-07-29 DIAGNOSIS — R002 Palpitations: Secondary | ICD-10-CM | POA: Diagnosis not present

## 2019-08-03 ENCOUNTER — Other Ambulatory Visit: Payer: Self-pay

## 2019-08-03 ENCOUNTER — Ambulatory Visit (HOSPITAL_COMMUNITY)
Admission: RE | Admit: 2019-08-03 | Discharge: 2019-08-03 | Disposition: A | Discharge status: Home / Self Care | Payer: BC Managed Care – PPO | Source: Ambulatory Visit | Attending: Cardiology | Admitting: Cardiology

## 2019-08-03 ENCOUNTER — Other Ambulatory Visit: Payer: Self-pay | Admitting: Adult Health

## 2019-08-03 DIAGNOSIS — R600 Localized edema: Secondary | ICD-10-CM | POA: Insufficient documentation

## 2019-08-14 ENCOUNTER — Ambulatory Visit: Payer: BC Managed Care – PPO | Admitting: Cardiology

## 2019-08-17 ENCOUNTER — Other Ambulatory Visit: Payer: Self-pay

## 2019-08-17 ENCOUNTER — Ambulatory Visit (HOSPITAL_COMMUNITY): Payer: BC Managed Care – PPO | Attending: Cardiovascular Disease

## 2019-08-17 DIAGNOSIS — R0602 Shortness of breath: Secondary | ICD-10-CM | POA: Diagnosis not present

## 2019-08-17 DIAGNOSIS — R600 Localized edema: Secondary | ICD-10-CM | POA: Insufficient documentation

## 2019-08-26 DIAGNOSIS — R5383 Other fatigue: Secondary | ICD-10-CM | POA: Diagnosis not present

## 2019-08-26 DIAGNOSIS — E559 Vitamin D deficiency, unspecified: Secondary | ICD-10-CM | POA: Diagnosis not present

## 2019-08-26 DIAGNOSIS — M545 Low back pain: Secondary | ICD-10-CM | POA: Diagnosis not present

## 2019-08-26 DIAGNOSIS — M2550 Pain in unspecified joint: Secondary | ICD-10-CM | POA: Diagnosis not present

## 2019-08-26 DIAGNOSIS — S43431A Superior glenoid labrum lesion of right shoulder, initial encounter: Secondary | ICD-10-CM | POA: Diagnosis not present

## 2019-08-28 DIAGNOSIS — R002 Palpitations: Secondary | ICD-10-CM | POA: Diagnosis not present

## 2019-09-02 DIAGNOSIS — M25511 Pain in right shoulder: Secondary | ICD-10-CM | POA: Diagnosis not present

## 2019-09-03 DIAGNOSIS — M47816 Spondylosis without myelopathy or radiculopathy, lumbar region: Secondary | ICD-10-CM | POA: Diagnosis not present

## 2019-09-03 DIAGNOSIS — M797 Fibromyalgia: Secondary | ICD-10-CM | POA: Diagnosis not present

## 2019-09-03 DIAGNOSIS — M25511 Pain in right shoulder: Secondary | ICD-10-CM | POA: Diagnosis not present

## 2019-09-03 DIAGNOSIS — Z5181 Encounter for therapeutic drug level monitoring: Secondary | ICD-10-CM | POA: Diagnosis not present

## 2019-09-03 DIAGNOSIS — Z79899 Other long term (current) drug therapy: Secondary | ICD-10-CM | POA: Diagnosis not present

## 2019-09-03 DIAGNOSIS — M542 Cervicalgia: Secondary | ICD-10-CM | POA: Diagnosis not present

## 2019-09-30 ENCOUNTER — Encounter: Payer: Self-pay | Admitting: *Deleted

## 2019-10-09 DIAGNOSIS — M25511 Pain in right shoulder: Secondary | ICD-10-CM | POA: Diagnosis not present

## 2019-10-09 DIAGNOSIS — M47816 Spondylosis without myelopathy or radiculopathy, lumbar region: Secondary | ICD-10-CM | POA: Diagnosis not present

## 2019-10-09 DIAGNOSIS — M542 Cervicalgia: Secondary | ICD-10-CM | POA: Diagnosis not present

## 2019-10-09 DIAGNOSIS — M797 Fibromyalgia: Secondary | ICD-10-CM | POA: Diagnosis not present

## 2019-10-15 DIAGNOSIS — R519 Headache, unspecified: Secondary | ICD-10-CM | POA: Diagnosis not present

## 2019-10-15 DIAGNOSIS — J302 Other seasonal allergic rhinitis: Secondary | ICD-10-CM | POA: Diagnosis not present

## 2019-10-15 DIAGNOSIS — Z20822 Contact with and (suspected) exposure to covid-19: Secondary | ICD-10-CM | POA: Diagnosis not present

## 2019-10-23 ENCOUNTER — Ambulatory Visit: Payer: BC Managed Care – PPO | Admitting: Cardiology

## 2019-10-23 ENCOUNTER — Other Ambulatory Visit: Payer: Self-pay | Admitting: Adult Health

## 2019-11-05 DIAGNOSIS — M797 Fibromyalgia: Secondary | ICD-10-CM | POA: Diagnosis not present

## 2019-11-05 DIAGNOSIS — M2550 Pain in unspecified joint: Secondary | ICD-10-CM | POA: Diagnosis not present

## 2019-11-05 DIAGNOSIS — M150 Primary generalized (osteo)arthritis: Secondary | ICD-10-CM | POA: Diagnosis not present

## 2019-11-05 DIAGNOSIS — R768 Other specified abnormal immunological findings in serum: Secondary | ICD-10-CM | POA: Diagnosis not present

## 2019-11-26 DIAGNOSIS — Z20822 Contact with and (suspected) exposure to covid-19: Secondary | ICD-10-CM | POA: Diagnosis not present

## 2019-12-04 DIAGNOSIS — M47816 Spondylosis without myelopathy or radiculopathy, lumbar region: Secondary | ICD-10-CM | POA: Diagnosis not present

## 2019-12-04 DIAGNOSIS — M25511 Pain in right shoulder: Secondary | ICD-10-CM | POA: Diagnosis not present

## 2019-12-04 DIAGNOSIS — M542 Cervicalgia: Secondary | ICD-10-CM | POA: Diagnosis not present

## 2019-12-04 DIAGNOSIS — M797 Fibromyalgia: Secondary | ICD-10-CM | POA: Diagnosis not present

## 2020-02-03 ENCOUNTER — Other Ambulatory Visit: Payer: BC Managed Care – PPO | Admitting: Adult Health

## 2020-02-04 ENCOUNTER — Encounter: Payer: Self-pay | Admitting: Adult Health

## 2020-02-04 ENCOUNTER — Other Ambulatory Visit (HOSPITAL_COMMUNITY)
Admission: RE | Admit: 2020-02-04 | Discharge: 2020-02-04 | Disposition: A | Discharge status: Home / Self Care | Payer: BC Managed Care – PPO | Source: Ambulatory Visit | Attending: Adult Health | Admitting: Adult Health

## 2020-02-04 ENCOUNTER — Ambulatory Visit (INDEPENDENT_AMBULATORY_CARE_PROVIDER_SITE_OTHER): Payer: BC Managed Care – PPO | Admitting: Adult Health

## 2020-02-04 ENCOUNTER — Other Ambulatory Visit: Payer: Self-pay

## 2020-02-04 VITALS — BP 108/69 | HR 89 | Ht 61.5 in | Wt 196.5 lb

## 2020-02-04 DIAGNOSIS — Z113 Encounter for screening for infections with a predominantly sexual mode of transmission: Secondary | ICD-10-CM

## 2020-02-04 DIAGNOSIS — Z1211 Encounter for screening for malignant neoplasm of colon: Secondary | ICD-10-CM

## 2020-02-04 DIAGNOSIS — F419 Anxiety disorder, unspecified: Secondary | ICD-10-CM

## 2020-02-04 DIAGNOSIS — Z3041 Encounter for surveillance of contraceptive pills: Secondary | ICD-10-CM | POA: Diagnosis not present

## 2020-02-04 DIAGNOSIS — Z01419 Encounter for gynecological examination (general) (routine) without abnormal findings: Secondary | ICD-10-CM

## 2020-02-04 DIAGNOSIS — F32A Depression, unspecified: Secondary | ICD-10-CM

## 2020-02-04 LAB — HEMOCCULT GUIAC POC 1CARD (OFFICE): Fecal Occult Blood, POC: NEGATIVE

## 2020-02-04 MED ORDER — BUPROPION HCL 100 MG PO TABS: 100.0000 mg | ORAL_TABLET | Freq: Two times a day (BID) | ORAL | 3 refills | Status: DC

## 2020-02-04 MED ORDER — NORETHINDRONE 0.35 MG PO TABS: 1.0000 | ORAL_TABLET | Freq: Every day | ORAL | 4 refills | Status: DC

## 2020-02-04 MED ORDER — DULOXETINE HCL 30 MG PO CPEP
30.0000 mg | ORAL_CAPSULE | Freq: Every day | ORAL | 3 refills | Status: DC
Start: 2020-02-04 — End: 2021-03-21

## 2020-02-04 NOTE — Progress Notes (Signed)
Patient ID: [name], female   DOB: 1980-10-27, 39 y.o.   MRN: [id number] History of Present Illness: [name] is a 39 year old black female,single, [phone] in for a well woman gyn exam,she had a normal pap with negative HPV 01/28/2019. PCP is Abigail Rieger NP   Current Medications, Allergies, Past Medical History, Past Surgical History, Family History and Social History were reviewed in [employer] record.     Review of Systems: Patient denies any headaches, hearing loss, fatigue, blurred vision, shortness of breath, chest pain, abdominal pain, problems with bowel movements, urination, or intercourse. No joint pain or mood swings. +body aches, has RA and chronic back pain   Physical Exam:BP 108/69 (BP Location: Left Arm, Patient Position: Sitting, Cuff Size: Normal)   Pulse 89   Ht 5' 1.5" (1.562 m)   Wt 196 lb 8 oz (89.1 kg)   LMP 01/30/2020   BMI 36.53 kg/m  General:  Well developed, well nourished, no acute distress Skin:  Warm and dry Neck:  Midline trachea, normal thyroid, good ROM, no lymphadenopathy Lungs; Clear to auscultation bilaterally Breast:  No dominant palpable mass, retraction, or nipple discharge Cardiovascular: Regular rate and rhythm Abdomen:  Soft, non tender, no hepatosplenomegaly Pelvic:  External genitalia is normal in appearance, no lesions.  The vagina is normal in appearance. Urethra has no lesions or masses. The cervix is bulbous.  Uterus is felt to be normal size, shape, and contour.  No adnexal masses or tenderness noted.Bladder is non tender, no masses felt.CV swab obtained. Rectal: Good sphincter tone, no polyps, or hemorrhoids felt.  Hemoccult negative.+rectocele  Extremities/musculoskeletal:  No swelling or varicosities noted, no clubbing or cyanosis Psych:  No mood changes, alert and cooperative,seems happy AA is 2 Fall risk is low PHQ 9 score is 21, no SI, take wellbutrin 2 x daily and weill add Cymbalta  She stopped  vistaril due to headache with it  [facility] - 02/04/20 0938      Pregnancy Intention Screening   Does the patient want to become pregnant in the next year? No    Does the patient's partner want to become pregnant in the next year? No    Would the patient like to discuss contraceptive options today? No      Contraception Wrap Up   Current Method Oral Contraceptive    End Method Oral Contraceptive    Contraception Counseling Provided No          Examination chaperoned by Malachy Mood LPN  Impression and Plan: 1. Encounter for well woman exam with routine gynecological exam Physical in 1 year Pap in 2023 Get mammogram now  2. Encounter for screening fecal occult blood testing   3. Encounter for surveillance of contraceptive pills Will continue OCs  Meds ordered this encounter  Medications  . norethindrone (MICRONOR) 0.35 MG tablet    Sig: Take 1 tablet (0.35 mg total) by mouth daily.    Dispense:  84 tablet    Refill:  4    Order Specific Question:   Supervising Provider    Answer:   Despina Hidden, LUTHER H [[id number]]  . buPROPion (WELLBUTRIN) 100 MG tablet    Sig: Take 1 tablet (100 mg total) by mouth 2 (two) times daily.    Dispense:  180 tablet    Refill:  3    Order Specific Question:   Supervising Provider    Answer:   Despina Hidden, LUTHER H [[id number]]  . DULoxetine (CYMBALTA) 30 MG capsule  Sig: Take 1 capsule (30 mg total) by mouth daily.    Dispense:  30 capsule    Refill:  3    Order Specific Question:   Supervising Provider    Answer:   Despina Hidden, LUTHER H [[id number]]    4. Anxiety and depression Take Wellbutrin 2 x daily and will add Cymbalta Follow up in 3 months, will get fasting labs then   5. Screening examination for STD (sexually transmitted disease) CV swab sent for GC/CHL and trich

## 2020-02-05 LAB — CERVICOVAGINAL ANCILLARY ONLY
Chlamydia: NEGATIVE
Comment: NEGATIVE
Comment: NEGATIVE
Comment: NORMAL
Neisseria Gonorrhea: NEGATIVE
Trichomonas: NEGATIVE

## 2020-02-14 DIAGNOSIS — Z20822 Contact with and (suspected) exposure to covid-19: Secondary | ICD-10-CM | POA: Diagnosis not present

## 2020-02-22 DIAGNOSIS — R0609 Other forms of dyspnea: Secondary | ICD-10-CM | POA: Diagnosis not present

## 2020-02-22 DIAGNOSIS — J208 Acute bronchitis due to other specified organisms: Secondary | ICD-10-CM | POA: Diagnosis not present

## 2020-02-22 DIAGNOSIS — Z8616 Personal history of COVID-19: Secondary | ICD-10-CM | POA: Diagnosis not present

## 2020-05-02 ENCOUNTER — Other Ambulatory Visit: Payer: Self-pay | Admitting: Adult Health

## 2020-05-04 DIAGNOSIS — R768 Other specified abnormal immunological findings in serum: Secondary | ICD-10-CM | POA: Diagnosis not present

## 2020-05-04 DIAGNOSIS — M2550 Pain in unspecified joint: Secondary | ICD-10-CM | POA: Diagnosis not present

## 2020-05-04 DIAGNOSIS — M150 Primary generalized (osteo)arthritis: Secondary | ICD-10-CM | POA: Diagnosis not present

## 2020-05-04 DIAGNOSIS — M797 Fibromyalgia: Secondary | ICD-10-CM | POA: Diagnosis not present

## 2020-05-05 ENCOUNTER — Ambulatory Visit: Payer: BC Managed Care – PPO | Admitting: Adult Health

## 2020-05-06 DIAGNOSIS — M797 Fibromyalgia: Secondary | ICD-10-CM | POA: Diagnosis not present

## 2020-05-06 DIAGNOSIS — M542 Cervicalgia: Secondary | ICD-10-CM | POA: Diagnosis not present

## 2020-05-06 DIAGNOSIS — M25511 Pain in right shoulder: Secondary | ICD-10-CM | POA: Diagnosis not present

## 2020-05-06 DIAGNOSIS — Z79899 Other long term (current) drug therapy: Secondary | ICD-10-CM | POA: Diagnosis not present

## 2020-05-06 DIAGNOSIS — G8921 Chronic pain due to trauma: Secondary | ICD-10-CM | POA: Diagnosis not present

## 2020-05-06 DIAGNOSIS — Z5181 Encounter for therapeutic drug level monitoring: Secondary | ICD-10-CM | POA: Diagnosis not present

## 2020-08-05 DIAGNOSIS — M79641 Pain in right hand: Secondary | ICD-10-CM | POA: Diagnosis not present

## 2020-08-05 DIAGNOSIS — M797 Fibromyalgia: Secondary | ICD-10-CM | POA: Diagnosis not present

## 2020-08-05 DIAGNOSIS — G8921 Chronic pain due to trauma: Secondary | ICD-10-CM | POA: Diagnosis not present

## 2020-08-05 DIAGNOSIS — M542 Cervicalgia: Secondary | ICD-10-CM | POA: Diagnosis not present

## 2020-09-07 DIAGNOSIS — Z885 Allergy status to narcotic agent status: Secondary | ICD-10-CM | POA: Diagnosis not present

## 2020-09-07 DIAGNOSIS — Z91040 Latex allergy status: Secondary | ICD-10-CM | POA: Diagnosis not present

## 2020-09-07 DIAGNOSIS — M79641 Pain in right hand: Secondary | ICD-10-CM | POA: Diagnosis not present

## 2020-11-04 DIAGNOSIS — M542 Cervicalgia: Secondary | ICD-10-CM | POA: Diagnosis not present

## 2020-11-04 DIAGNOSIS — G8921 Chronic pain due to trauma: Secondary | ICD-10-CM | POA: Diagnosis not present

## 2020-11-04 DIAGNOSIS — M79641 Pain in right hand: Secondary | ICD-10-CM | POA: Diagnosis not present

## 2020-11-04 DIAGNOSIS — M797 Fibromyalgia: Secondary | ICD-10-CM | POA: Diagnosis not present

## 2020-11-27 ENCOUNTER — Other Ambulatory Visit: Payer: Self-pay | Admitting: Adult Health

## 2021-02-02 DIAGNOSIS — G8921 Chronic pain due to trauma: Secondary | ICD-10-CM | POA: Diagnosis not present

## 2021-02-04 DIAGNOSIS — Z20822 Contact with and (suspected) exposure to covid-19: Secondary | ICD-10-CM | POA: Diagnosis not present

## 2021-02-07 ENCOUNTER — Other Ambulatory Visit: Payer: BC Managed Care – PPO | Admitting: Adult Health

## 2021-03-21 ENCOUNTER — Other Ambulatory Visit (HOSPITAL_COMMUNITY)
Admission: RE | Admit: 2021-03-21 | Discharge: 2021-03-21 | Disposition: A | Discharge status: Home / Self Care | Payer: BC Managed Care – PPO | Source: Ambulatory Visit | Attending: Adult Health | Admitting: Adult Health

## 2021-03-21 ENCOUNTER — Other Ambulatory Visit: Payer: Self-pay

## 2021-03-21 ENCOUNTER — Encounter: Payer: Self-pay | Admitting: Adult Health

## 2021-03-21 ENCOUNTER — Ambulatory Visit (INDEPENDENT_AMBULATORY_CARE_PROVIDER_SITE_OTHER): Payer: BC Managed Care – PPO | Admitting: Adult Health

## 2021-03-21 VITALS — BP 105/68 | HR 97 | Ht 61.0 in | Wt 193.0 lb

## 2021-03-21 DIAGNOSIS — Z1329 Encounter for screening for other suspected endocrine disorder: Secondary | ICD-10-CM | POA: Diagnosis not present

## 2021-03-21 DIAGNOSIS — Z1231 Encounter for screening mammogram for malignant neoplasm of breast: Secondary | ICD-10-CM | POA: Diagnosis not present

## 2021-03-21 DIAGNOSIS — Z13220 Encounter for screening for lipoid disorders: Secondary | ICD-10-CM

## 2021-03-21 DIAGNOSIS — R7989 Other specified abnormal findings of blood chemistry: Secondary | ICD-10-CM | POA: Diagnosis not present

## 2021-03-21 DIAGNOSIS — Z01419 Encounter for gynecological examination (general) (routine) without abnormal findings: Secondary | ICD-10-CM | POA: Diagnosis not present

## 2021-03-21 DIAGNOSIS — Z1211 Encounter for screening for malignant neoplasm of colon: Secondary | ICD-10-CM

## 2021-03-21 DIAGNOSIS — Z3041 Encounter for surveillance of contraceptive pills: Secondary | ICD-10-CM | POA: Diagnosis not present

## 2021-03-21 LAB — HEMOCCULT GUIAC POC 1CARD (OFFICE): Fecal Occult Blood, POC: NEGATIVE

## 2021-03-21 MED ORDER — NORETHINDRONE 0.35 MG PO TABS: 1.0000 | ORAL_TABLET | Freq: Every day | ORAL | 4 refills | Status: DC

## 2021-03-21 NOTE — Progress Notes (Signed)
Patient ID: [name], female   DOB: November 26, 1980, 41 y.o.   MRN: [id number] History of Present Illness: [name] is a 41 year old black female, single, [id number], in for well woman gyn exam and pap. No current PCP, she sees pain doctor in [name]. Her mom's brother died today.  She requests labs today.    Current Medications, Allergies, Past Medical History, Past Surgical History, Family History and Social History were reviewed in [name] record.     Review of Systems: Patient denies any headaches, hearing loss, fatigue, blurred vision, shortness of breath, chest pain, abdominal pain, problems with bowel movements, urination, or intercourse(not active). No new joint pain or mood swings.     Physical Exam:BP 105/68 (BP Location: Left Arm, Patient Position: Sitting, Cuff Size: Normal)    Pulse 97    Ht 5\' 1"  (1.549 m)    Wt 193 lb (87.5 kg)    LMP 02/20/2021 (Approximate)    BMI 36.47 kg/m   General:  Well developed, well nourished, no acute distress Skin:  Warm and dry Neck:  Midline trachea, normal thyroid, good ROM, no lymphadenopathy Lungs; Clear to auscultation bilaterally Breast:  No dominant palpable mass, retraction, or nipple discharge Cardiovascular: Regular rate and rhythm Abdomen:  Soft, non tender, no hepatosplenomegaly Pelvic:  External genitalia is normal in appearance, no lesions.  The vagina is normal in appearance. Urethra has no lesions or masses. The cervix is bulbous. Pap with HR HPV genotyping and GC/CHL performed. Uterus is felt to be normal size, shape, and contour.  No adnexal masses or tenderness noted.Bladder is non tender, no masses felt. Rectal: Good sphincter tone, no polyps, or hemorrhoids felt.  Hemoccult negative. Extremities/musculoskeletal:  No swelling or varicosities noted, no clubbing or cyanosis Psych:  No mood changes, alert and cooperative,seems happy AA is 1 Fall risk is low Depression screen [facility] 2/9 03/21/2021  02/04/2020 01/28/2019  Decreased Interest 1 3 1   Down, Depressed, Hopeless 1 2 1   PHQ - 2 Score 2 5 2   Altered sleeping 1 3 1   Tired, decreased energy 1 3 1   Change in appetite 1 3 1   Feeling bad or failure about yourself  0 3 1  Trouble concentrating 0 2 1  Moving slowly or fidgety/restless 0 2 1  Suicidal thoughts 0 0 0  PHQ-9 Score 5 21 8   Difficult doing work/chores - - Somewhat difficult    GAD 7 : Generalized Anxiety Score 03/21/2021 02/04/2020  Nervous, Anxious, on Edge 1 3  Control/stop worrying 1 3  Worry too much - different things 1 3  Trouble relaxing 1 3  Restless 0 2  Easily annoyed or irritable 1 3  Afraid - awful might happen 1 0  Total GAD 7 Score 6 17    Upstream - 03/21/21 1031       Pregnancy Intention Screening   Does the patient want to become pregnant in the next year? No    Does the patient's partner want to become pregnant in the next year? No    Would the patient like to discuss contraceptive options today? No      Contraception Wrap Up   Current Method Oral Contraceptive    End Method Oral Contraceptive    Contraception Counseling Provided No              Examination chaperoned by LPN   Impression and Plan: 1. Encounter for gynecological examination with Papanicolaou smear of cervix Pap sent  Physical in 1 year Pap in 3 years of normal Will check labs  - Cytology - PAP( [place]) - CBC - Comprehensive metabolic panel  2. Encounter for screening fecal occult blood testing - POCT occult blood stool  3. Screening mammogram for breast cancer She will call [name] for appt - MM 3D SCREEN BREAST BILATERAL; Future  4. Encounter for surveillance of contraceptive pills Continue micronor  Meds ordered this encounter  Medications   norethindrone (MICRONOR) 0.35 MG tablet    Sig: Take 1 tablet (0.35 mg total) by mouth daily.    Dispense:  84 tablet    Refill:  4    Order Specific Question:   Supervising  Provider    Answer:   [name], [name] [[id number]]     5. Screening cholesterol level - Lipid panel  6. Screening for thyroid disorder - TSH  7. Low vitamin D level - VITAMIN D 25 Hydroxy (Vit-D Deficiency, Fractures)

## 2021-03-22 LAB — COMPREHENSIVE METABOLIC PANEL
ALT: 10 IU/L (ref 0–32)
AST: 12 IU/L (ref 0–40)
Albumin/Globulin Ratio: 1.6 (ref 1.2–2.2)
Albumin: 4.2 g/dL (ref 3.8–4.8)
Alkaline Phosphatase: 96 IU/L (ref 44–121)
BUN/Creatinine Ratio: 14 (ref 9–23)
BUN: 13 mg/dL (ref 6–24)
Bilirubin Total: 0.2 mg/dL (ref 0.0–1.2)
CO2: 19 mmol/L — ABNORMAL LOW (ref 20–29)
Calcium: 8.7 mg/dL (ref 8.7–10.2)
Chloride: 109 mmol/L — ABNORMAL HIGH (ref 96–106)
Creatinine, Ser: 0.96 mg/dL (ref 0.57–1.00)
Globulin, Total: 2.6 g/dL (ref 1.5–4.5)
Glucose: 95 mg/dL (ref 70–99)
Potassium: 4.2 mmol/L (ref 3.5–5.2)
Sodium: 141 mmol/L (ref 134–144)
Total Protein: 6.8 g/dL (ref 6.0–8.5)
eGFR: 76 mL/min/{1.73_m2} (ref >59)

## 2021-03-22 LAB — CBC
Hematocrit: 35.6 % (ref 34.0–46.6)
Hemoglobin: 12.4 g/dL (ref 11.1–15.9)
MCH: 31.8 pg (ref 26.6–33.0)
MCHC: 34.8 g/dL (ref 31.5–35.7)
MCV: 91 fL (ref 79–97)
Platelets: 295 10*3/uL (ref 150–450)
RBC: 3.9 x10E6/uL (ref 3.77–5.28)
RDW: 11.9 % (ref 11.7–15.4)
WBC: 5.4 10*3/uL (ref 3.4–10.8)

## 2021-03-22 LAB — TSH: TSH: 1.54 u[IU]/mL (ref 0.450–4.500)

## 2021-03-22 LAB — LIPID PANEL
Chol/HDL Ratio: 2.7 ratio (ref 0.0–4.4)
Cholesterol, Total: 124 mg/dL (ref 100–199)
HDL: 46 mg/dL (ref >39)
LDL Chol Calc (NIH): 65 mg/dL (ref 0–99)
Triglycerides: 62 mg/dL (ref 0–149)
VLDL Cholesterol Cal: 13 mg/dL (ref 5–40)

## 2021-03-22 LAB — VITAMIN D 25 HYDROXY (VIT D DEFICIENCY, FRACTURES): Vit D, 25-Hydroxy: 31.4 ng/mL (ref 30.0–100.0)

## 2021-03-23 LAB — CYTOLOGY - PAP
Adequacy: ABSENT
Chlamydia: NEGATIVE
Comment: NEGATIVE
Comment: NEGATIVE
Comment: NORMAL
Diagnosis: NEGATIVE
High risk HPV: NEGATIVE
Neisseria Gonorrhea: NEGATIVE

## 2021-04-13 ENCOUNTER — Ambulatory Visit
Admission: RE | Admit: 2021-04-13 | Discharge: 2021-04-13 | Disposition: A | Discharge status: Home / Self Care | Payer: BC Managed Care – PPO | Source: Ambulatory Visit | Attending: Adult Health | Admitting: Adult Health

## 2021-04-13 DIAGNOSIS — Z1231 Encounter for screening mammogram for malignant neoplasm of breast: Secondary | ICD-10-CM | POA: Diagnosis not present

## 2021-04-14 ENCOUNTER — Other Ambulatory Visit: Payer: Self-pay | Admitting: Adult Health

## 2021-04-14 DIAGNOSIS — R928 Other abnormal and inconclusive findings on diagnostic imaging of breast: Secondary | ICD-10-CM

## 2021-04-27 DIAGNOSIS — M533 Sacrococcygeal disorders, not elsewhere classified: Secondary | ICD-10-CM | POA: Diagnosis not present

## 2021-04-27 DIAGNOSIS — M797 Fibromyalgia: Secondary | ICD-10-CM | POA: Diagnosis not present

## 2021-04-27 DIAGNOSIS — M25511 Pain in right shoulder: Secondary | ICD-10-CM | POA: Diagnosis not present

## 2021-04-27 DIAGNOSIS — M79641 Pain in right hand: Secondary | ICD-10-CM | POA: Diagnosis not present

## 2021-04-27 DIAGNOSIS — G8921 Chronic pain due to trauma: Secondary | ICD-10-CM | POA: Diagnosis not present

## 2021-05-01 ENCOUNTER — Ambulatory Visit
Admission: RE | Admit: 2021-05-01 | Discharge: 2021-05-01 | Disposition: A | Discharge status: Home / Self Care | Payer: BC Managed Care – PPO | Source: Ambulatory Visit | Attending: Adult Health | Admitting: Adult Health

## 2021-05-01 ENCOUNTER — Other Ambulatory Visit: Payer: Self-pay

## 2021-05-01 DIAGNOSIS — R928 Other abnormal and inconclusive findings on diagnostic imaging of breast: Secondary | ICD-10-CM

## 2021-05-01 DIAGNOSIS — R922 Inconclusive mammogram: Secondary | ICD-10-CM | POA: Diagnosis not present

## 2021-05-01 DIAGNOSIS — N6001 Solitary cyst of right breast: Secondary | ICD-10-CM | POA: Diagnosis not present

## 2021-06-14 ENCOUNTER — Other Ambulatory Visit: Payer: Self-pay | Admitting: Adult Health

## 2021-07-20 DIAGNOSIS — M797 Fibromyalgia: Secondary | ICD-10-CM | POA: Diagnosis not present

## 2021-07-20 DIAGNOSIS — G8921 Chronic pain due to trauma: Secondary | ICD-10-CM | POA: Diagnosis not present

## 2021-07-20 DIAGNOSIS — M25511 Pain in right shoulder: Secondary | ICD-10-CM | POA: Diagnosis not present

## 2021-07-20 DIAGNOSIS — M533 Sacrococcygeal disorders, not elsewhere classified: Secondary | ICD-10-CM | POA: Diagnosis not present

## 2021-08-14 ENCOUNTER — Ambulatory Visit: Payer: BC Managed Care – PPO | Admitting: Nurse Practitioner

## 2021-08-16 ENCOUNTER — Encounter: Payer: Self-pay | Admitting: Nurse Practitioner

## 2021-08-16 ENCOUNTER — Ambulatory Visit: Payer: BC Managed Care – PPO | Admitting: Nurse Practitioner

## 2021-08-16 VITALS — BP 128/74 | HR 76 | Temp 98.1°F | Ht 61.0 in | Wt 193.4 lb

## 2021-08-16 DIAGNOSIS — F419 Anxiety disorder, unspecified: Secondary | ICD-10-CM

## 2021-08-16 DIAGNOSIS — F32A Depression, unspecified: Secondary | ICD-10-CM

## 2021-08-16 MED ORDER — BUSPIRONE HCL 10 MG PO TABS: 10.0000 mg | ORAL_TABLET | Freq: Two times a day (BID) | ORAL | 2 refills | Status: DC

## 2021-08-16 NOTE — Patient Instructions (Addendum)
Check out [facility] online  Check out [facility] in [place]  t

## 2021-08-16 NOTE — Progress Notes (Signed)
   Subjective:    Patient ID: [name], female    DOB: [birth year], 41 y.o.   MRN: [id number]  HPI  Patient here to establish care.  Patient states that she is doing well overall however she has been under a lot of stress.  Patient states that she is a single mother of a special needs child that causes a lot of stress however patient states the majority of her stress is coming from work.  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  Patient states that work environment is causing a lot of her anxiety and she finds herself being irritated.  Patient states she just needs a mental health break.  Patient has no other complaints today.  Review of Systems  Psychiatric/Behavioral:  Positive for agitation.        Anxiety       Objective:   Physical Exam Vitals reviewed.  Constitutional:      General: She is not in acute distress.    Appearance: Normal appearance. She is normal weight. She is not ill-appearing, toxic-appearing or diaphoretic.  HENT:     Head: Normocephalic and atraumatic.  Musculoskeletal:     Comments: Grossly intact  Neurological:     Mental Status: She is alert.     Comments: Grossly intact  Psychiatric:        Mood and Affect: Mood normal.        Behavior: Behavior normal.        Assessment & Plan:   1. Anxiety and depression -We will trial patient on BuSpar for anxiety -Patient states that she is trialed Wellbutrin, Zoloft, and Celexa in the past but does not feel like they have helped. -Patient willing to trial Celexa again if BuSpar alone not helpful. - busPIRone (BUSPAR) 10 MG tablet; Take 1 tablet (10 mg total) by mouth 2 (two) times daily.  Dispense: 60 tablet; Refill: 2 -Allow for patient to have to mental health days away from work.  However discussed that continued days from work would not be recommended if not  medically necessary. -Encourage patient to seek out [facility] for counseling services in addition to putting in referral for counseling -Discussed cognitive behavioral therapy and encourage patient to read the feeling good book by Dr. Allyson Sabal. - Ambulatory referral to Psychology -Patient denies any thoughts when herself or anyone else -GAD-7 score is 9 today -PHQ-9 is 7 today -Return to clinic in 6 weeks for evaluation of anxiety    Note:  This document was prepared using Dragon voice recognition software and may include unintentional dictation errors. Note - This record has been created using [employer].  Chart creation errors have been sought, but may not always  have been located. Such creation errors do not reflect on  the standard of medical care.

## 2021-08-21 DIAGNOSIS — F331 Major depressive disorder, recurrent, moderate: Secondary | ICD-10-CM | POA: Diagnosis not present

## 2021-08-21 DIAGNOSIS — F330 Major depressive disorder, recurrent, mild: Secondary | ICD-10-CM | POA: Diagnosis not present

## 2021-08-21 DIAGNOSIS — F411 Generalized anxiety disorder: Secondary | ICD-10-CM | POA: Diagnosis not present

## 2021-08-21 DIAGNOSIS — F4389 Other reactions to severe stress: Secondary | ICD-10-CM | POA: Diagnosis not present

## 2021-09-04 DIAGNOSIS — F411 Generalized anxiety disorder: Secondary | ICD-10-CM | POA: Diagnosis not present

## 2021-09-04 DIAGNOSIS — F4389 Other reactions to severe stress: Secondary | ICD-10-CM | POA: Diagnosis not present

## 2021-09-04 DIAGNOSIS — F330 Major depressive disorder, recurrent, mild: Secondary | ICD-10-CM | POA: Diagnosis not present

## 2021-09-04 DIAGNOSIS — F331 Major depressive disorder, recurrent, moderate: Secondary | ICD-10-CM | POA: Diagnosis not present

## 2021-09-12 DIAGNOSIS — F411 Generalized anxiety disorder: Secondary | ICD-10-CM | POA: Diagnosis not present

## 2021-09-12 DIAGNOSIS — F330 Major depressive disorder, recurrent, mild: Secondary | ICD-10-CM | POA: Diagnosis not present

## 2021-09-12 DIAGNOSIS — F4381 Prolonged grief disorder: Secondary | ICD-10-CM | POA: Diagnosis not present

## 2021-09-18 DIAGNOSIS — F4389 Other reactions to severe stress: Secondary | ICD-10-CM | POA: Diagnosis not present

## 2021-09-18 DIAGNOSIS — F411 Generalized anxiety disorder: Secondary | ICD-10-CM | POA: Diagnosis not present

## 2021-09-18 DIAGNOSIS — F331 Major depressive disorder, recurrent, moderate: Secondary | ICD-10-CM | POA: Diagnosis not present

## 2021-09-18 DIAGNOSIS — F330 Major depressive disorder, recurrent, mild: Secondary | ICD-10-CM | POA: Diagnosis not present

## 2021-09-27 ENCOUNTER — Encounter: Payer: Self-pay | Admitting: Nurse Practitioner

## 2021-09-27 ENCOUNTER — Ambulatory Visit: Payer: BC Managed Care – PPO | Admitting: Nurse Practitioner

## 2021-09-27 VITALS — BP 106/62 | HR 72 | Ht 61.0 in | Wt 195.6 lb

## 2021-09-27 DIAGNOSIS — F32A Depression, unspecified: Secondary | ICD-10-CM

## 2021-09-27 DIAGNOSIS — F419 Anxiety disorder, unspecified: Secondary | ICD-10-CM | POA: Diagnosis not present

## 2021-09-27 NOTE — Progress Notes (Signed)
   Subjective:    Patient ID: [name], female    DOB: 1981/01/17, 41 y.o.   MRN: [id number]  HPI  6 week follow up on anxiety. Patient states that she feels a lot better. Patient is now on both Buspar and effexor which she states helps. Patient states that she is doing therapy with [facility] which is also helping. Patient also states that her her job is less toxic since they have hired new people.  Overall, patient states that she is doing well and is excited about her upcoming vacation to the beach.    Review of Systems  All other systems reviewed and are negative.      Objective:   Physical Exam Constitutional:      General: She is not in acute distress.    Appearance: Normal appearance. She is obese. She is not ill-appearing, toxic-appearing or diaphoretic.  HENT:     Head: Normocephalic and atraumatic.  Neurological:     Mental Status: She is alert.  Psychiatric:        Mood and Affect: Mood normal.        Behavior: Behavior normal.         Assessment & Plan:   1. Anxiety and depression - Anxiety appears to be well contolled. - Continue to follow up with [facility] as scheduled - RTC as needed.

## 2021-10-10 DIAGNOSIS — F411 Generalized anxiety disorder: Secondary | ICD-10-CM | POA: Diagnosis not present

## 2021-10-10 DIAGNOSIS — F330 Major depressive disorder, recurrent, mild: Secondary | ICD-10-CM | POA: Diagnosis not present

## 2021-10-10 DIAGNOSIS — F331 Major depressive disorder, recurrent, moderate: Secondary | ICD-10-CM | POA: Diagnosis not present

## 2021-10-12 DIAGNOSIS — M533 Sacrococcygeal disorders, not elsewhere classified: Secondary | ICD-10-CM | POA: Diagnosis not present

## 2021-10-12 DIAGNOSIS — M25511 Pain in right shoulder: Secondary | ICD-10-CM | POA: Diagnosis not present

## 2021-10-12 DIAGNOSIS — M79641 Pain in right hand: Secondary | ICD-10-CM | POA: Diagnosis not present

## 2021-10-12 DIAGNOSIS — M47816 Spondylosis without myelopathy or radiculopathy, lumbar region: Secondary | ICD-10-CM | POA: Diagnosis not present

## 2021-11-07 DIAGNOSIS — F331 Major depressive disorder, recurrent, moderate: Secondary | ICD-10-CM | POA: Diagnosis not present

## 2021-11-07 DIAGNOSIS — F411 Generalized anxiety disorder: Secondary | ICD-10-CM | POA: Diagnosis not present

## 2021-11-21 ENCOUNTER — Other Ambulatory Visit: Payer: Self-pay | Admitting: Nurse Practitioner

## 2021-11-21 DIAGNOSIS — F419 Anxiety disorder, unspecified: Secondary | ICD-10-CM

## 2021-12-17 ENCOUNTER — Other Ambulatory Visit: Payer: Self-pay | Admitting: Adult Health

## 2022-01-02 DIAGNOSIS — F4381 Prolonged grief disorder: Secondary | ICD-10-CM | POA: Diagnosis not present

## 2022-01-02 DIAGNOSIS — F411 Generalized anxiety disorder: Secondary | ICD-10-CM | POA: Diagnosis not present

## 2022-01-02 DIAGNOSIS — F331 Major depressive disorder, recurrent, moderate: Secondary | ICD-10-CM | POA: Diagnosis not present

## 2022-01-04 DIAGNOSIS — M9903 Segmental and somatic dysfunction of lumbar region: Secondary | ICD-10-CM | POA: Diagnosis not present

## 2022-01-04 DIAGNOSIS — M9902 Segmental and somatic dysfunction of thoracic region: Secondary | ICD-10-CM | POA: Diagnosis not present

## 2022-01-04 DIAGNOSIS — M9901 Segmental and somatic dysfunction of cervical region: Secondary | ICD-10-CM | POA: Diagnosis not present

## 2022-01-04 DIAGNOSIS — M9904 Segmental and somatic dysfunction of sacral region: Secondary | ICD-10-CM | POA: Diagnosis not present

## 2022-01-10 DIAGNOSIS — M9902 Segmental and somatic dysfunction of thoracic region: Secondary | ICD-10-CM | POA: Diagnosis not present

## 2022-01-10 DIAGNOSIS — M9901 Segmental and somatic dysfunction of cervical region: Secondary | ICD-10-CM | POA: Diagnosis not present

## 2022-01-10 DIAGNOSIS — M9904 Segmental and somatic dysfunction of sacral region: Secondary | ICD-10-CM | POA: Diagnosis not present

## 2022-01-10 DIAGNOSIS — M9903 Segmental and somatic dysfunction of lumbar region: Secondary | ICD-10-CM | POA: Diagnosis not present

## 2022-01-24 DIAGNOSIS — M25552 Pain in left hip: Secondary | ICD-10-CM | POA: Diagnosis not present

## 2022-01-24 DIAGNOSIS — M25551 Pain in right hip: Secondary | ICD-10-CM | POA: Diagnosis not present

## 2022-01-24 DIAGNOSIS — M797 Fibromyalgia: Secondary | ICD-10-CM | POA: Diagnosis not present

## 2022-01-24 DIAGNOSIS — M542 Cervicalgia: Secondary | ICD-10-CM | POA: Diagnosis not present

## 2022-01-24 DIAGNOSIS — M533 Sacrococcygeal disorders, not elsewhere classified: Secondary | ICD-10-CM | POA: Diagnosis not present

## 2022-01-24 DIAGNOSIS — M25511 Pain in right shoulder: Secondary | ICD-10-CM | POA: Diagnosis not present

## 2022-02-05 DIAGNOSIS — J189 Pneumonia, unspecified organism: Secondary | ICD-10-CM | POA: Diagnosis not present

## 2022-02-13 ENCOUNTER — Other Ambulatory Visit: Payer: Self-pay

## 2022-02-13 ENCOUNTER — Telehealth: Payer: Self-pay | Admitting: Nurse Practitioner

## 2022-02-13 DIAGNOSIS — F32A Depression, unspecified: Secondary | ICD-10-CM

## 2022-02-13 MED ORDER — BUSPIRONE HCL 10 MG PO TABS: 10.0000 mg | ORAL_TABLET | Freq: Two times a day (BID) | ORAL | 0 refills | Status: DC

## 2022-02-13 NOTE — Telephone Encounter (Signed)
Prescription Request  02/13/2022  Is this a "Controlled Substance" medicine? No  LOV: 09/27/2021  What is the name of the medication or equipment? busPIRone (BUSPAR) 10 MG tablet   Have you contacted your pharmacy to request a refill? Yes   Which pharmacy would you like this sent to?  [id number] Judithann Sheen, [place] - [address] [place] [address] Phone: [phone] Fax: [phone]    Patient notified that their request is being sent to the clinical staff for review and that they should receive a response within 2 business days.   Please advise at [facility] [phone]

## 2022-02-27 DIAGNOSIS — F411 Generalized anxiety disorder: Secondary | ICD-10-CM | POA: Diagnosis not present

## 2022-02-27 DIAGNOSIS — F331 Major depressive disorder, recurrent, moderate: Secondary | ICD-10-CM | POA: Diagnosis not present

## 2022-02-27 DIAGNOSIS — Z1331 Encounter for screening for depression: Secondary | ICD-10-CM | POA: Diagnosis not present

## 2022-03-23 ENCOUNTER — Ambulatory Visit (INDEPENDENT_AMBULATORY_CARE_PROVIDER_SITE_OTHER): Payer: BC Managed Care – PPO | Admitting: Adult Health

## 2022-03-23 ENCOUNTER — Encounter: Payer: Self-pay | Admitting: Adult Health

## 2022-03-23 VITALS — BP 114/72 | HR 95 | Ht 61.0 in | Wt 207.0 lb

## 2022-03-23 DIAGNOSIS — Z13220 Encounter for screening for lipoid disorders: Secondary | ICD-10-CM | POA: Diagnosis not present

## 2022-03-23 DIAGNOSIS — M7989 Other specified soft tissue disorders: Secondary | ICD-10-CM | POA: Diagnosis not present

## 2022-03-23 DIAGNOSIS — Z3041 Encounter for surveillance of contraceptive pills: Secondary | ICD-10-CM | POA: Diagnosis not present

## 2022-03-23 DIAGNOSIS — Z01419 Encounter for gynecological examination (general) (routine) without abnormal findings: Secondary | ICD-10-CM | POA: Diagnosis not present

## 2022-03-23 DIAGNOSIS — Z1211 Encounter for screening for malignant neoplasm of colon: Secondary | ICD-10-CM

## 2022-03-23 DIAGNOSIS — Z1231 Encounter for screening mammogram for malignant neoplasm of breast: Secondary | ICD-10-CM

## 2022-03-23 DIAGNOSIS — Z1329 Encounter for screening for other suspected endocrine disorder: Secondary | ICD-10-CM | POA: Insufficient documentation

## 2022-03-23 DIAGNOSIS — R7989 Other specified abnormal findings of blood chemistry: Secondary | ICD-10-CM | POA: Insufficient documentation

## 2022-03-23 LAB — HEMOCCULT GUIAC POC 1CARD (OFFICE): Fecal Occult Blood, POC: NEGATIVE

## 2022-03-23 MED ORDER — NORETHINDRONE 0.35 MG PO TABS: 1.0000 | ORAL_TABLET | Freq: Every day | ORAL | 4 refills | Status: DC

## 2022-03-23 MED ORDER — HYDROCHLOROTHIAZIDE 25 MG PO TABS: 25.0000 mg | ORAL_TABLET | Freq: Every day | ORAL | 1 refills | Status: DC

## 2022-03-23 NOTE — Progress Notes (Signed)
Patient ID: [name], female   DOB: 06/28/1980, 42 y.o.   MRN: [id number] History of Present Illness: [name] is a 42 year old black female,single, [phone], in for well woman gyn exam. ?felt something in vagina, ?cyst.   Last pap was 03/21/21, negative HPV and NILM  PCP is Dr Lacinda Axon   Current Medications, Allergies, Past Medical History, Past Surgical History, Family History and Social History were reviewed in [employer] record.     Review of Systems: Patient denies any headaches, hearing loss, fatigue, blurred vision, shortness of breath, chest pain, abdominal pain, problems with bowel movements, urination, or intercourse. No mood swings.  Has back and knee pain, has OA and fibromyalgia Has swelling at times    Physical Exam:BP 114/72 (BP Location: Left Arm, Patient Position: Sitting, Cuff Size: Normal)   Pulse 95   Ht 5\' 1"  (1.549 m)   Wt 207 lb (93.9 kg)   LMP 02/20/2022   BMI 39.11 kg/m   General:  Well developed, well nourished, no acute distress Skin:  Warm and dry Neck:  Midline trachea, normal thyroid, good ROM, no lymphadenopathy Lungs; Clear to auscultation bilaterally Breast:  No dominant palpable mass, retraction, or nipple discharge Cardiovascular: Regular rate and rhythm Abdomen:  Soft, non tender, no hepatosplenomegaly Pelvic:  External genitalia is normal in appearance, no lesions.  The vagina is normal in appearance. Urethra has no lesions or masses. The cervix is bulbous,has nabothian cyst at 6 0'clock.  Uterus is felt to be normal size, shape, and contour.  No adnexal masses or tenderness noted.Bladder is non tender, no masses felt. Rectal: Good sphincter tone, no polyps, or hemorrhoids felt.  Hemoccult negative. Extremities/musculoskeletal:  No swelling or varicosities noted, no clubbing or cyanosis Psych:  No mood changes, alert and cooperative,seems happy AA is 1 Fall risk is low    03/23/2022   10:05 AM 09/27/2021    2:34 PM  08/16/2021    4:27 PM  Depression screen PHQ 2/9  Decreased Interest 2 0 1  Down, Depressed, Hopeless 0 0 1  PHQ - 2 Score 2 0 2  Altered sleeping 2  1  Tired, decreased energy 2  2  Change in appetite 2  1  Feeling bad or failure about yourself  0  0  Trouble concentrating 2  1  Moving slowly or fidgety/restless 2  0  Suicidal thoughts 0  0  PHQ-9 Score 12  7  Difficult doing work/chores   Somewhat difficult   On meds and sees Brayton Mars Spaulding    03/23/2022   10:06 AM 08/16/2021    4:26 PM 03/21/2021   10:44 AM 02/04/2020    9:39 AM  GAD 7 : Generalized Anxiety Score  Nervous, Anxious, on Edge 3 2 1 3   Control/stop worrying 2 1 1 3   Worry too much - different things 2 1 1 3   Trouble relaxing 3 2 1 3   Restless 2 1 0 2  Easily annoyed or irritable 3 2 1 3   Afraid - awful might happen 0 0 1 0  Total GAD 7 Score 15 9 6 17   Anxiety Difficulty  Somewhat difficult        Upstream - 03/23/22 1011       Pregnancy Intention Screening   Does the patient want to become pregnant in the next year? No    Does the patient's partner want to become pregnant in the next year? No    Would the patient  like to discuss contraceptive options today? No      Contraception Wrap Up   Current Method Oral Contraceptive    End Method Oral Contraceptive            Examination chaperoned by Levy Pupa LPN   Impression and Plan 1. Encounter for screening fecal occult blood testing Hemoccult was negative   2. Encounter for surveillance of contraceptive pills -will continue Micronor  Meds ordered this encounter  Medications   hydrochlorothiazide (HYDRODIURIL) 25 MG tablet    Sig: Take 1 tablet (25 mg total) by mouth daily.    Dispense:  90 tablet    Refill:  1    Order Specific Question:   Supervising Provider    Answer:   Tania Ade H [[id number]]   norethindrone (MICRONOR) 0.35 MG tablet    Sig: Take 1 tablet (0.35 mg total) by mouth daily.    Dispense:  84 tablet    Refill:   4    Order Specific Question:   Supervising Provider    Answer:   Elonda Husky, LUTHER H [[id number]]     3. Encounter for gynecological examination Physical in 1 year Pap 2026 Mammogram end of February  Will check labs - CBC - Comprehensive metabolic panel  4. Swelling of both lower extremities Will refill hydrodiuril  5. Screening mammogram for breast cancer Pt will call for appt  - MM 3D SCREEN BREAST BILATERAL; Future  6. Screening for thyroid disorder - TSH  7. Screening cholesterol level - Lipid panel  8. Low vitamin D level Taking vitamin D - VITAMIN D 25 Hydroxy (Vit-D Deficiency, Fractures)

## 2022-03-24 LAB — COMPREHENSIVE METABOLIC PANEL
ALT: 9 IU/L (ref 0–32)
AST: 9 IU/L (ref 0–40)
Albumin/Globulin Ratio: 1.4 (ref 1.2–2.2)
Albumin: 3.9 g/dL (ref 3.9–4.9)
Alkaline Phosphatase: 96 IU/L (ref 44–121)
BUN/Creatinine Ratio: 13 (ref 9–23)
BUN: 12 mg/dL (ref 6–24)
Bilirubin Total: 0.2 mg/dL (ref 0.0–1.2)
CO2: 22 mmol/L (ref 20–29)
Calcium: 8.4 mg/dL — ABNORMAL LOW (ref 8.7–10.2)
Chloride: 104 mmol/L (ref 96–106)
Creatinine, Ser: 0.91 mg/dL (ref 0.57–1.00)
Globulin, Total: 2.7 g/dL (ref 1.5–4.5)
Glucose: 87 mg/dL (ref 70–99)
Potassium: 4.2 mmol/L (ref 3.5–5.2)
Sodium: 139 mmol/L (ref 134–144)
Total Protein: 6.6 g/dL (ref 6.0–8.5)
eGFR: 81 mL/min/{1.73_m2} (ref >59)

## 2022-03-24 LAB — LIPID PANEL
Chol/HDL Ratio: 2.3 ratio (ref 0.0–4.4)
Cholesterol, Total: 121 mg/dL (ref 100–199)
HDL: 53 mg/dL (ref >39)
LDL Chol Calc (NIH): 57 mg/dL (ref 0–99)
Triglycerides: 44 mg/dL (ref 0–149)
VLDL Cholesterol Cal: 11 mg/dL (ref 5–40)

## 2022-03-24 LAB — CBC
Hematocrit: 38.7 % (ref 34.0–46.6)
Hemoglobin: 12.9 g/dL (ref 11.1–15.9)
MCH: 31.7 pg (ref 26.6–33.0)
MCHC: 33.3 g/dL (ref 31.5–35.7)
MCV: 95 fL (ref 79–97)
Platelets: 301 10*3/uL (ref 150–450)
RBC: 4.07 x10E6/uL (ref 3.77–5.28)
RDW: 12.5 % (ref 11.7–15.4)
WBC: 6.6 10*3/uL (ref 3.4–10.8)

## 2022-03-24 LAB — TSH: TSH: 1.58 u[IU]/mL (ref 0.450–4.500)

## 2022-03-24 LAB — VITAMIN D 25 HYDROXY (VIT D DEFICIENCY, FRACTURES): Vit D, 25-Hydroxy: 28.4 ng/mL — ABNORMAL LOW (ref 30.0–100.0)

## 2022-04-16 ENCOUNTER — Ambulatory Visit
Admission: RE | Admit: 2022-04-16 | Discharge: 2022-04-16 | Disposition: A | Discharge status: Home / Self Care | Payer: BC Managed Care – PPO | Source: Ambulatory Visit | Attending: Adult Health | Admitting: Adult Health

## 2022-04-16 DIAGNOSIS — Z1231 Encounter for screening mammogram for malignant neoplasm of breast: Secondary | ICD-10-CM

## 2022-04-25 DIAGNOSIS — G8929 Other chronic pain: Secondary | ICD-10-CM | POA: Diagnosis not present

## 2022-04-25 DIAGNOSIS — M542 Cervicalgia: Secondary | ICD-10-CM | POA: Diagnosis not present

## 2022-04-25 DIAGNOSIS — M797 Fibromyalgia: Secondary | ICD-10-CM | POA: Diagnosis not present

## 2022-04-25 DIAGNOSIS — M25511 Pain in right shoulder: Secondary | ICD-10-CM | POA: Diagnosis not present

## 2022-05-15 ENCOUNTER — Ambulatory Visit: Payer: BC Managed Care – PPO | Admitting: Family Medicine

## 2022-05-15 VITALS — BP 122/78 | HR 101 | Temp 97.7°F | Wt 210.0 lb

## 2022-05-15 DIAGNOSIS — J209 Acute bronchitis, unspecified: Secondary | ICD-10-CM

## 2022-05-15 MED ORDER — PREDNISONE 50 MG PO TABS: 50.0000 mg | ORAL_TABLET | Freq: Every day | ORAL | 0 refills | Status: AC

## 2022-05-15 NOTE — Assessment & Plan Note (Signed)
Advised albuterol use as needed.  Placing on prednisone.  Supportive care.  Work note given.

## 2022-05-15 NOTE — Patient Instructions (Signed)
Steroid as prescribed.  Use the albuterol.  Call with concerns.  Take care  Dr. Lacinda Axon

## 2022-05-15 NOTE — Progress Notes (Signed)
Subjective:  Patient ID: [name], female    DOB: 1980/07/16  Age: 42 y.o. MRN: [id number]  CC: Chief Complaint  Patient presents with   Cough    Wheezing, runny nose hx of bronchitis    HPI:  42 year old female presents for evaluation of the above.  Patient and her son have been sick recently.  Patient states that she has had symptoms over the past 4 days.  She has had runny nose, congestion, cough, wheezing, and shortness of breath.  Has a history of bronchitis.  She is not using her albuterol inhaler.  No fever.  No other associated symptoms.  No other complaints.  Patient Active Problem List   Diagnosis Date Noted   Acute bronchitis 05/15/2022   Periodic heart flutter 07/10/2019   Swelling of both lower extremities 06/26/2019   Body mass index 37.0-37.9, adult 04/09/2016   Vitamin D deficiency 12/27/2015   Arthralgia of both knees 03/11/2015   Anxiety and depression 07/01/2014   IBS (irritable bowel syndrome) 07/01/2014   Obesity 09/29/2013    Social Hx   Social History   Socioeconomic History   Marital status: Single    Spouse name: Not on file   Number of children: 3   Years of education: Not on file   Highest education level: Some college, no degree  Occupational History   Not on file  Tobacco Use   Smoking status: Light Smoker    Packs/day: 0.25    Years: 10.00    Additional pack years: 0.00    Total pack years: 2.50    Types: Cigarettes    Last attempt to quit: 06/26/2011    Years since quitting: 10.8   Smokeless tobacco: Never  Vaping Use   Vaping Use: Never used  Substance and Sexual Activity   Alcohol use: Yes    Alcohol/week: 0.0 standard drinks of alcohol    Comment: occ   Drug use: No   Sexual activity: Yes    Birth control/protection: Pill  Other Topics Concern   Not on file  Social History Narrative   Single.   Three children.   Secondary school teacher in [place]   Enjoys traveling, especially in [place] area which is where her family  lives.    Social Determinants of Health   Financial Resource Strain: Low Risk  (05/15/2022)   Overall Financial Resource Strain (CARDIA)    Difficulty of Paying Living Expenses: Not hard at all  Food Insecurity: No Food Insecurity (05/15/2022)   Hunger Vital Sign    Worried About Running Out of Food in the Last Year: Never true    Ran Out of Food in the Last Year: Never true  Transportation Needs: No Transportation Needs (05/15/2022)   PRAPARE - Hydrologist (Medical): No    Lack of Transportation (Non-Medical): No  Physical Activity: Insufficiently Active (05/15/2022)   Exercise Vital Sign    Days of Exercise per Week: 5 days    Minutes of Exercise per Session: 20 min  Stress: Stress Concern Present (05/15/2022)   [place]    Feeling of Stress : To some extent  Social Connections: Moderately Integrated (05/15/2022)   Social Connection and Isolation Panel [NHANES]    Frequency of Communication with Friends and Family: More than three times a week    Frequency of Social Gatherings with Friends and Family: More than three times a week    Attends Religious Services:  1 to 4 times per year    Active Member of Clubs or Organizations: Yes    Attends Archivist Meetings: 1 to 4 times per year    Marital Status: Never married    Review of Systems Per HPI  Objective:  BP 122/78   Pulse (!) 101   Temp 97.7 F (36.5 C)   Wt 210 lb (95.3 kg)   SpO2 98%   BMI 39.68 kg/m      05/15/2022   11:19 AM 03/23/2022   10:06 AM 09/27/2021    2:32 PM  BP/Weight  Systolic BP [id number]  Diastolic BP 78 72 62  Wt. (Lbs) 210 207 195.6  BMI 39.68 kg/m2 39.11 kg/m2 36.96 kg/m2    Physical Exam Vitals and nursing note reviewed.  Constitutional:      General: She is not in acute distress.    Appearance: Normal appearance.  HENT:     Head: Normocephalic and atraumatic.  Eyes:     General:         Right eye: No discharge.        Left eye: No discharge.     Conjunctiva/sclera: Conjunctivae normal.  Cardiovascular:     Rate and Rhythm: Normal rate and regular rhythm.  Pulmonary:     Effort: Pulmonary effort is normal.     Breath sounds: No wheezing, rhonchi or rales.  Neurological:     Mental Status: She is alert.  Psychiatric:        Mood and Affect: Mood normal.        Behavior: Behavior normal.     Lab Results  Component Value Date   WBC 6.6 03/23/2022   HGB 12.9 03/23/2022   HCT 38.7 03/23/2022   PLT 301 03/23/2022   GLUCOSE 87 03/23/2022   CHOL 121 03/23/2022   TRIG 44 03/23/2022   HDL 53 03/23/2022   LDLCALC 57 03/23/2022   ALT 9 03/23/2022   AST 9 03/23/2022   NA 139 03/23/2022   K 4.2 03/23/2022   CL 104 03/23/2022   CREATININE 0.91 03/23/2022   BUN 12 03/23/2022   CO2 22 03/23/2022   TSH 1.580 03/23/2022   HGBA1C 5.5 07/27/2019     Assessment & Plan:   Problem List Items Addressed This Visit       Respiratory   Acute bronchitis - Primary    Advised albuterol use as needed.  Placing on prednisone.  Supportive care.  Work note given.       Meds ordered this encounter  Medications   predniSONE (DELTASONE) 50 MG tablet    Sig: Take 1 tablet (50 mg total) by mouth daily for 5 days.    Dispense:  5 tablet    Refill:  0    Follow-up:  Return if symptoms worsen or fail to improve.  [place]

## 2022-06-14 DIAGNOSIS — F330 Major depressive disorder, recurrent, mild: Secondary | ICD-10-CM | POA: Diagnosis not present

## 2022-06-14 DIAGNOSIS — F411 Generalized anxiety disorder: Secondary | ICD-10-CM | POA: Diagnosis not present

## 2022-07-18 DIAGNOSIS — Z79891 Long term (current) use of opiate analgesic: Secondary | ICD-10-CM | POA: Diagnosis not present

## 2022-07-18 DIAGNOSIS — Z79899 Other long term (current) drug therapy: Secondary | ICD-10-CM | POA: Diagnosis not present

## 2022-07-18 DIAGNOSIS — M5136 Other intervertebral disc degeneration, lumbar region: Secondary | ICD-10-CM | POA: Diagnosis not present

## 2022-07-18 DIAGNOSIS — Z5181 Encounter for therapeutic drug level monitoring: Secondary | ICD-10-CM | POA: Diagnosis not present

## 2022-07-18 DIAGNOSIS — G8921 Chronic pain due to trauma: Secondary | ICD-10-CM | POA: Diagnosis not present

## 2022-07-18 DIAGNOSIS — M47816 Spondylosis without myelopathy or radiculopathy, lumbar region: Secondary | ICD-10-CM | POA: Diagnosis not present

## 2022-09-06 DIAGNOSIS — F411 Generalized anxiety disorder: Secondary | ICD-10-CM | POA: Diagnosis not present

## 2022-09-06 DIAGNOSIS — F331 Major depressive disorder, recurrent, moderate: Secondary | ICD-10-CM | POA: Diagnosis not present

## 2022-10-10 DIAGNOSIS — M5136 Other intervertebral disc degeneration, lumbar region: Secondary | ICD-10-CM | POA: Diagnosis not present

## 2022-10-10 DIAGNOSIS — M47816 Spondylosis without myelopathy or radiculopathy, lumbar region: Secondary | ICD-10-CM | POA: Diagnosis not present

## 2022-10-10 DIAGNOSIS — M542 Cervicalgia: Secondary | ICD-10-CM | POA: Diagnosis not present

## 2022-10-10 DIAGNOSIS — Z79891 Long term (current) use of opiate analgesic: Secondary | ICD-10-CM | POA: Diagnosis not present

## 2022-11-07 ENCOUNTER — Encounter: Payer: Self-pay | Admitting: Family Medicine

## 2022-11-07 DIAGNOSIS — Z0279 Encounter for issue of other medical certificate: Secondary | ICD-10-CM

## 2022-11-29 DIAGNOSIS — F411 Generalized anxiety disorder: Secondary | ICD-10-CM | POA: Diagnosis not present

## 2022-11-29 DIAGNOSIS — F330 Major depressive disorder, recurrent, mild: Secondary | ICD-10-CM | POA: Diagnosis not present

## 2022-12-16 ENCOUNTER — Other Ambulatory Visit: Payer: Self-pay | Admitting: Adult Health

## 2022-12-21 DIAGNOSIS — J209 Acute bronchitis, unspecified: Secondary | ICD-10-CM | POA: Diagnosis not present

## 2022-12-21 DIAGNOSIS — J189 Pneumonia, unspecified organism: Secondary | ICD-10-CM | POA: Diagnosis not present

## 2023-01-02 DIAGNOSIS — G894 Chronic pain syndrome: Secondary | ICD-10-CM | POA: Diagnosis not present

## 2023-01-02 DIAGNOSIS — Z79891 Long term (current) use of opiate analgesic: Secondary | ICD-10-CM | POA: Diagnosis not present

## 2023-01-02 DIAGNOSIS — M47816 Spondylosis without myelopathy or radiculopathy, lumbar region: Secondary | ICD-10-CM | POA: Diagnosis not present

## 2023-01-02 DIAGNOSIS — M542 Cervicalgia: Secondary | ICD-10-CM | POA: Diagnosis not present

## 2023-01-10 DIAGNOSIS — F331 Major depressive disorder, recurrent, moderate: Secondary | ICD-10-CM | POA: Diagnosis not present

## 2023-01-10 DIAGNOSIS — F411 Generalized anxiety disorder: Secondary | ICD-10-CM | POA: Diagnosis not present

## 2023-02-11 DIAGNOSIS — J209 Acute bronchitis, unspecified: Secondary | ICD-10-CM | POA: Diagnosis not present

## 2023-02-18 ENCOUNTER — Ambulatory Visit: Payer: BC Managed Care – PPO | Admitting: Family Medicine

## 2023-02-18 ENCOUNTER — Encounter: Payer: Self-pay | Admitting: Family Medicine

## 2023-02-18 VITALS — BP 103/68 | HR 110 | Temp 98.6°F | Ht 61.0 in | Wt 221.0 lb

## 2023-02-18 DIAGNOSIS — J019 Acute sinusitis, unspecified: Secondary | ICD-10-CM | POA: Diagnosis not present

## 2023-02-18 DIAGNOSIS — J4521 Mild intermittent asthma with (acute) exacerbation: Secondary | ICD-10-CM | POA: Diagnosis not present

## 2023-02-18 MED ORDER — PREDNISONE 20 MG PO TABS: ORAL_TABLET | ORAL | 0 refills | Status: DC

## 2023-02-18 MED ORDER — HYDROCODONE BIT-HOMATROP MBR 5-1.5 MG/5ML PO SOLN: 5.0000 mL | Freq: Four times a day (QID) | ORAL | 0 refills | Status: AC | PRN

## 2023-02-18 MED ORDER — FLUCONAZOLE 150 MG PO TABS: 150.0000 mg | ORAL_TABLET | Freq: Once | ORAL | 4 refills | Status: AC

## 2023-02-18 MED ORDER — AMOXICILLIN-POT CLAVULANATE 875-125 MG PO TABS: 1.0000 | ORAL_TABLET | Freq: Two times a day (BID) | ORAL | 0 refills | Status: DC

## 2023-02-18 NOTE — Progress Notes (Signed)
   Subjective:    Patient ID: [name], female    DOB: 1980/12/04, 42 y.o.   MRN: [id number]  HPI Patient with about 6 to 7 days of head congestion drainage coughing chest congestion wheezing feeling short of breath was seen urgent care and they treated her with prednisone and azithromycin she states azithromycin causes nausea and belly pain for her so she did not take azithromycin She denies any major setbacks otherwise She states she gets these spells in the late fall early winter She wonders if she may have underlying asthma she has a family history of asthma She does smoke she knows she needs to quit   Review of Systems     Objective:   Physical Exam Minimal expiratory wheezes bilateral no respiratory distress HEENT is benign mild sinus tenderness  Patient not toxic     Assessment & Plan:  Mild sinusitis Reactive airway Smoker Encourage patient to quit smoking Prednisone taper Albuterol as needed Patient states the urgent care prescribed her an albuterol nebulizer she is going to be getting this in the near future I did discuss with her consideration for pulmonary function test either through our office or through allergists asthma doctor Patient to call back to give [place] update in a few weeks time If she does the PFT through our office and then a follow-up visit with [place] if she chooses allergist asthma doctor they would be able to handle all of that Patient may benefit from additional inhalers

## 2023-02-28 DIAGNOSIS — F331 Major depressive disorder, recurrent, moderate: Secondary | ICD-10-CM | POA: Diagnosis not present

## 2023-02-28 DIAGNOSIS — F411 Generalized anxiety disorder: Secondary | ICD-10-CM | POA: Diagnosis not present

## 2023-04-04 ENCOUNTER — Ambulatory Visit: Payer: BC Managed Care – PPO | Admitting: Adult Health

## 2023-04-04 ENCOUNTER — Encounter: Payer: Self-pay | Admitting: Adult Health

## 2023-04-04 ENCOUNTER — Telehealth: Payer: Self-pay | Admitting: Family Medicine

## 2023-04-04 ENCOUNTER — Other Ambulatory Visit (HOSPITAL_COMMUNITY)
Admission: RE | Admit: 2023-04-04 | Discharge: 2023-04-04 | Disposition: A | Discharge status: Home / Self Care | Payer: BC Managed Care – PPO | Source: Ambulatory Visit | Attending: Adult Health | Admitting: Adult Health

## 2023-04-04 VITALS — BP 108/72 | HR 93 | Ht 61.0 in | Wt 219.0 lb

## 2023-04-04 DIAGNOSIS — Z113 Encounter for screening for infections with a predominantly sexual mode of transmission: Secondary | ICD-10-CM | POA: Insufficient documentation

## 2023-04-04 DIAGNOSIS — Z1329 Encounter for screening for other suspected endocrine disorder: Secondary | ICD-10-CM | POA: Diagnosis not present

## 2023-04-04 DIAGNOSIS — M7989 Other specified soft tissue disorders: Secondary | ICD-10-CM

## 2023-04-04 DIAGNOSIS — E559 Vitamin D deficiency, unspecified: Secondary | ICD-10-CM | POA: Diagnosis not present

## 2023-04-04 DIAGNOSIS — Z01419 Encounter for gynecological examination (general) (routine) without abnormal findings: Secondary | ICD-10-CM | POA: Diagnosis not present

## 2023-04-04 DIAGNOSIS — Z13220 Encounter for screening for lipoid disorders: Secondary | ICD-10-CM | POA: Diagnosis not present

## 2023-04-04 DIAGNOSIS — N898 Other specified noninflammatory disorders of vagina: Secondary | ICD-10-CM

## 2023-04-04 DIAGNOSIS — Z1211 Encounter for screening for malignant neoplasm of colon: Secondary | ICD-10-CM

## 2023-04-04 DIAGNOSIS — Z1331 Encounter for screening for depression: Secondary | ICD-10-CM

## 2023-04-04 DIAGNOSIS — L0292 Furuncle, unspecified: Secondary | ICD-10-CM

## 2023-04-04 DIAGNOSIS — N816 Rectocele: Secondary | ICD-10-CM | POA: Diagnosis not present

## 2023-04-04 DIAGNOSIS — Z1231 Encounter for screening mammogram for malignant neoplasm of breast: Secondary | ICD-10-CM

## 2023-04-04 DIAGNOSIS — Z3041 Encounter for surveillance of contraceptive pills: Secondary | ICD-10-CM | POA: Diagnosis not present

## 2023-04-04 LAB — HEMOCCULT GUIAC POC 1CARD (OFFICE): Fecal Occult Blood, POC: NEGATIVE

## 2023-04-04 MED ORDER — NORETHINDRONE 0.35 MG PO TABS
1.0000 | ORAL_TABLET | Freq: Every day | ORAL | 4 refills | Status: AC
Start: 2023-04-04 — End: ?

## 2023-04-04 MED ORDER — SULFAMETHOXAZOLE-TRIMETHOPRIM 800-160 MG PO TABS: 1.0000 | ORAL_TABLET | Freq: Two times a day (BID) | ORAL | 0 refills | Status: AC

## 2023-04-04 MED ORDER — FLUCONAZOLE 150 MG PO TABS: ORAL_TABLET | ORAL | 1 refills | Status: AC

## 2023-04-04 MED ORDER — HYDROCHLOROTHIAZIDE 25 MG PO TABS: 25.0000 mg | ORAL_TABLET | Freq: Every day | ORAL | 2 refills | Status: AC

## 2023-04-04 NOTE — Telephone Encounter (Signed)
Patient dropped off form to be completed in your box

## 2023-04-04 NOTE — Progress Notes (Signed)
Patient ID: [name], female   DOB: 10/01/1980, 43 y.o.   MRN: [id number] History of Present Illness: [name] is a 43 year old black female, single, [id number], in for a well woman gyn exam. She is stressed, working and going to [employer] now. Has noticed bump left inner labia, for about a week, put tea tree oil on it and tried to pop it.  Has noticed a vaginal discharge with odor.      Component Value Date/Time   DIAGPAP  03/21/2021 1032    - Negative for intraepithelial lesion or malignancy (NILM)   DIAGPAP  01/28/2019 1210    - Negative for intraepithelial lesion or malignancy (NILM)   DIAGPAP  12/26/2015 0000    NEGATIVE FOR INTRAEPITHELIAL LESIONS OR MALIGNANCY.   DIAGPAP TRICHOMONAS VAGINALIS PRESENT. 12/26/2015 0000   HPVHIGH Negative 03/21/2021 1032   HPVHIGH Negative 01/28/2019 1210   ADEQPAP  03/21/2021 1032    Satisfactory for evaluation; transformation zone component ABSENT.   ADEQPAP  01/28/2019 1210    Satisfactory for evaluation; transformation zone component PRESENT.   ADEQPAP  12/26/2015 0000    Satisfactory for evaluation  endocervical/transformation zone component PRESENT.    PCP is Dr Adriana Simas   Current Medications, Allergies, Past Medical History, Past Surgical History, Family History and Social History were reviewed in [employer] [facility] electronic medical record.     Review of Systems: Patient denies any hearing loss, fatigue, blurred vision, shortness of breath, chest pain, abdominal pain, problems with bowel movements, urination, or intercourse.(Not currently active). No joint pain or mood swings.  Has occasional headache, see HPI for positives   Physical Exam:BP 108/72 (BP Location: Left Arm, Patient Position: Sitting, Cuff Size: Normal)   Pulse 93   Ht 5\' 1"  (1.549 m)   Wt 219 lb (99.3 kg)   LMP 03/14/2023 (Exact Date)   BMI 41.38 kg/m   General:  Well developed, well nourished, no acute distress Skin:  Warm and dry Neck:  Midline trachea, normal thyroid,  good ROM, no lymphadenopathy Lungs; Clear to auscultation bilaterally Breast:  No dominant palpable mass, retraction, or nipple discharge Cardiovascular: Regular rate and rhythm Abdomen:  Soft, non tender, no hepatosplenomegaly Pelvic:  External genitalia is normal in appearance, has 1 cm boil left inner labia.  The vagina is normal in appearance, has white discharge with slight odor. Urethra has no lesions or masses. The cervix is bulbous.  Uterus is felt to be normal size, shape, and contour.  No adnexal masses or tenderness noted.Bladder is non tender, no masses felt. Rectal: Good sphincter tone, no polyps, or hemorrhoids felt.  Hemoccult negative.+rectocele Extremities/musculoskeletal:  No swelling or varicosities noted, no clubbing or cyanosis Psych:  No mood changes, alert and cooperative,seems happy AA is 2 Fall risk is low    04/04/2023    8:44 AM 02/18/2023    4:37 PM 03/23/2022   10:05 AM  Depression screen PHQ 2/9  Decreased Interest 1 3 2   Down, Depressed, Hopeless 0 3 0  PHQ - 2 Score 1 6 2   Altered sleeping 2 3 2   Tired, decreased energy 2 3 2   Change in appetite 2 3 2   Feeling bad or failure about yourself  0 2 0  Trouble concentrating 2 3 2   Moving slowly or fidgety/restless 1 2 2   Suicidal thoughts 0 0 0  PHQ-9 Score 10 22 12   Difficult doing work/chores  Extremely dIfficult    She sees psychiatrist and is on meds  04/04/2023    8:44 AM 02/18/2023    4:38 PM 03/23/2022   10:06 AM 08/16/2021    4:26 PM  GAD 7 : Generalized Anxiety Score  Nervous, Anxious, on Edge 3 3 3 2   Control/stop worrying 3 3 2 1   Worry too much - different things 3 3 2 1   Trouble relaxing 3 3 3 2   Restless 3 3 2 1   Easily annoyed or irritable 3 3 3 2   Afraid - awful might happen 0 0 0 0  Total GAD 7 Score 18 18 15 9   Anxiety Difficulty  Extremely difficult  Somewhat difficult    Upstream - 04/04/23 0844       Pregnancy Intention Screening   Does the patient want to become pregnant  in the next year? No    Does the patient's partner want to become pregnant in the next year? No    Would the patient like to discuss contraceptive options today? No      Contraception Wrap Up   Current Method Oral Contraceptive    End Method Oral Contraceptive    Contraception Counseling Provided Yes            Examination chaperoned by Malachy Mood LPN    Impression and Plan: 1. Encounter for well woman exam with routine gynecological exam (Primary) Physical in 1 year Pap in 2026 Will check labs Mammogram was negative 04/16/22 - CBC - Comprehensive metabolic panel - Lipid panel  2. Encounter for screening fecal occult blood testing Hemoccult was negative  - POCT occult blood stool  3. Encounter for surveillance of contraceptive pills Happy with Micronor, will refill  4. Rectocele  5. Boil Has 1 cm boil left inner labia, can use warm compress,m do not squeeze Will rx septra ds and diflucan  Meds ordered this encounter  Medications   hydrochlorothiazide (HYDRODIURIL) 25 MG tablet    Sig: Take 1 tablet (25 mg total) by mouth daily.    Dispense:  90 tablet    Refill:  2    Supervising Provider:   Duane Lope H [[id number]]   norethindrone (MICRONOR) 0.35 MG tablet    Sig: Take 1 tablet (0.35 mg total) by mouth daily.    Dispense:  84 tablet    Refill:  4    Supervising Provider:   Duane Lope H [[id number]]   sulfamethoxazole-trimethoprim (BACTRIM DS) 800-160 MG tablet    Sig: Take 1 tablet by mouth 2 (two) times daily. Take 1 bid    Dispense:  14 tablet    Refill:  0    Supervising Provider:   Duane Lope H [[id number]]   fluconazole (DIFLUCAN) 150 MG tablet    Sig: Take 1 now and 1 in 3 days if needed    Dispense:  2 tablet    Refill:  1    Supervising Provider:   Duane Lope H [[id number]]     6. Vaginal discharge CV swab sent for GC/CHL,trich, BV and yeast  - Cervicovaginal ancillary only( [place])  7. Vaginal odor Slight odor CV swab sent  - Cervicovaginal  ancillary only( [facility] )  8. Screening examination for STD (sexually transmitted disease) CV swab sent for GC/CHL,trich, BV and yeast Check HIV and RPR - Cervicovaginal ancillary only( [place]) - HIV Antibody (routine testing w rflx) - RPR  9. Screening cholesterol level - Lipid panel  10. Screening for thyroid disorder - TSH + free T4  11. Mild vitamin D deficiency She does take  vitamin D3 - VITAMIN D 25 Hydroxy (Vit-D Deficiency, Fractures)  12. Swelling of both lower extremities Has swelling most days Will refill hydrochlorothiazide 25 mg 1 daily   13. Screening mammogram for breast cancer Pt to call for appt - MM 3D SCREENING MAMMOGRAM BILATERAL BREAST; Future

## 2023-04-05 LAB — CERVICOVAGINAL ANCILLARY ONLY
Bacterial Vaginitis (gardnerella): POSITIVE — AB
Candida Glabrata: NEGATIVE
Candida Vaginitis: NEGATIVE
Chlamydia: NEGATIVE
Comment: NEGATIVE
Comment: NEGATIVE
Comment: NEGATIVE
Comment: NEGATIVE
Comment: NEGATIVE
Comment: NORMAL
Neisseria Gonorrhea: NEGATIVE
Trichomonas: POSITIVE — AB

## 2023-04-06 LAB — VITAMIN D 25 HYDROXY (VIT D DEFICIENCY, FRACTURES): Vit D, 25-Hydroxy: 36.2 ng/mL (ref 30.0–100.0)

## 2023-04-06 LAB — CBC
Hematocrit: 41 % (ref 34.0–46.6)
Hemoglobin: 13.3 g/dL (ref 11.1–15.9)
MCH: 30.3 pg (ref 26.6–33.0)
MCHC: 32.4 g/dL (ref 31.5–35.7)
MCV: 93 fL (ref 79–97)
Platelets: 350 10*3/uL (ref 150–450)
RBC: 4.39 x10E6/uL (ref 3.77–5.28)
RDW: 12.7 % (ref 11.7–15.4)
WBC: 6.8 10*3/uL (ref 3.4–10.8)

## 2023-04-06 LAB — COMPREHENSIVE METABOLIC PANEL
ALT: 12 [IU]/L (ref 0–32)
AST: 13 [IU]/L (ref 0–40)
Albumin: 4 g/dL (ref 3.9–4.9)
Alkaline Phosphatase: 102 [IU]/L (ref 44–121)
BUN/Creatinine Ratio: 16 (ref 9–23)
BUN: 14 mg/dL (ref 6–24)
Bilirubin Total: 0.3 mg/dL (ref 0.0–1.2)
CO2: 21 mmol/L (ref 20–29)
Calcium: 8.8 mg/dL (ref 8.7–10.2)
Chloride: 102 mmol/L (ref 96–106)
Creatinine, Ser: 0.89 mg/dL (ref 0.57–1.00)
Globulin, Total: 2.7 g/dL (ref 1.5–4.5)
Glucose: 93 mg/dL (ref 70–99)
Potassium: 3.6 mmol/L (ref 3.5–5.2)
Sodium: 140 mmol/L (ref 134–144)
Total Protein: 6.7 g/dL (ref 6.0–8.5)
eGFR: 82 mL/min/{1.73_m2} (ref >59)

## 2023-04-06 LAB — RPR: RPR Ser Ql: NONREACTIVE

## 2023-04-06 LAB — LIPID PANEL
Chol/HDL Ratio: 2.6 {ratio} (ref 0.0–4.4)
Cholesterol, Total: 120 mg/dL (ref 100–199)
HDL: 47 mg/dL (ref >39)
LDL Chol Calc (NIH): 59 mg/dL (ref 0–99)
Triglycerides: 69 mg/dL (ref 0–149)
VLDL Cholesterol Cal: 14 mg/dL (ref 5–40)

## 2023-04-06 LAB — HIV ANTIBODY (ROUTINE TESTING W REFLEX): HIV Screen 4th Generation wRfx: NONREACTIVE

## 2023-04-06 LAB — TSH+FREE T4
Free T4: 1.06 ng/dL (ref 0.82–1.77)
TSH: 2.19 u[IU]/mL (ref 0.450–4.500)

## 2023-04-09 ENCOUNTER — Other Ambulatory Visit: Payer: Self-pay | Admitting: Adult Health

## 2023-04-09 MED ORDER — METRONIDAZOLE 500 MG PO TABS: 500.0000 mg | ORAL_TABLET | Freq: Two times a day (BID) | ORAL | 0 refills | Status: AC

## 2023-04-18 DIAGNOSIS — Z79891 Long term (current) use of opiate analgesic: Secondary | ICD-10-CM | POA: Diagnosis not present

## 2023-04-18 DIAGNOSIS — G894 Chronic pain syndrome: Secondary | ICD-10-CM | POA: Diagnosis not present

## 2023-04-18 DIAGNOSIS — Z5181 Encounter for therapeutic drug level monitoring: Secondary | ICD-10-CM | POA: Diagnosis not present

## 2023-04-18 DIAGNOSIS — Z79899 Other long term (current) drug therapy: Secondary | ICD-10-CM | POA: Diagnosis not present

## 2023-04-18 DIAGNOSIS — G5601 Carpal tunnel syndrome, right upper limb: Secondary | ICD-10-CM | POA: Diagnosis not present

## 2023-05-23 DIAGNOSIS — F411 Generalized anxiety disorder: Secondary | ICD-10-CM | POA: Diagnosis not present

## 2023-05-23 DIAGNOSIS — F331 Major depressive disorder, recurrent, moderate: Secondary | ICD-10-CM | POA: Diagnosis not present

## 2023-05-30 ENCOUNTER — Encounter

## 2023-05-30 ENCOUNTER — Ambulatory Visit
Admission: RE | Admit: 2023-05-30 | Discharge: 2023-05-30 | Disposition: A | Discharge status: Home / Self Care | Source: Ambulatory Visit | Attending: Adult Health | Admitting: Adult Health

## 2023-05-30 DIAGNOSIS — Z1231 Encounter for screening mammogram for malignant neoplasm of breast: Secondary | ICD-10-CM

## 2023-07-09 DIAGNOSIS — Z79891 Long term (current) use of opiate analgesic: Secondary | ICD-10-CM | POA: Diagnosis not present

## 2023-07-09 DIAGNOSIS — M25561 Pain in right knee: Secondary | ICD-10-CM | POA: Diagnosis not present

## 2023-07-09 DIAGNOSIS — M25562 Pain in left knee: Secondary | ICD-10-CM | POA: Diagnosis not present

## 2023-07-09 DIAGNOSIS — G894 Chronic pain syndrome: Secondary | ICD-10-CM | POA: Diagnosis not present

## 2023-07-09 DIAGNOSIS — M797 Fibromyalgia: Secondary | ICD-10-CM | POA: Diagnosis not present

## 2023-07-22 IMAGING — US US BREAST*R* LIMITED INC AXILLA
1 series · 7 of 7 positions shown · non-contrast
Comparison: Previous exam(s).

CLINICAL DATA: Patient was recalled from screening mammogram for a
possible right breast mass.

EXAM:
DIGITAL DIAGNOSTIC UNILATERAL RIGHT MAMMOGRAM WITH TOMOSYNTHESIS AND
CAD; ULTRASOUND RIGHT BREAST LIMITED
TECHNIQUE: Right digital diagnostic mammography and breast tomosynthesis was
performed. The images were evaluated with computer-aided detection.;
Targeted ultrasound examination of the right breast was performed

[Series 1: us breast*right* limited inc axilla · 0.07mm/px · 7 of 7 slices shown]
[im 1/7]
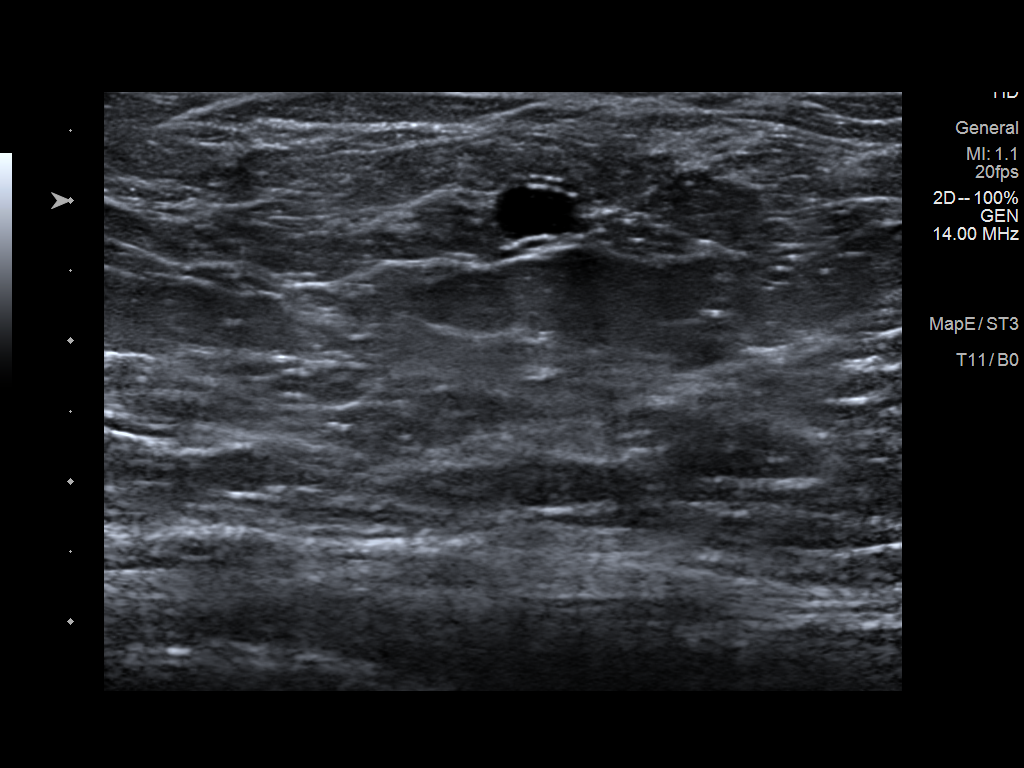
[im 2/7]
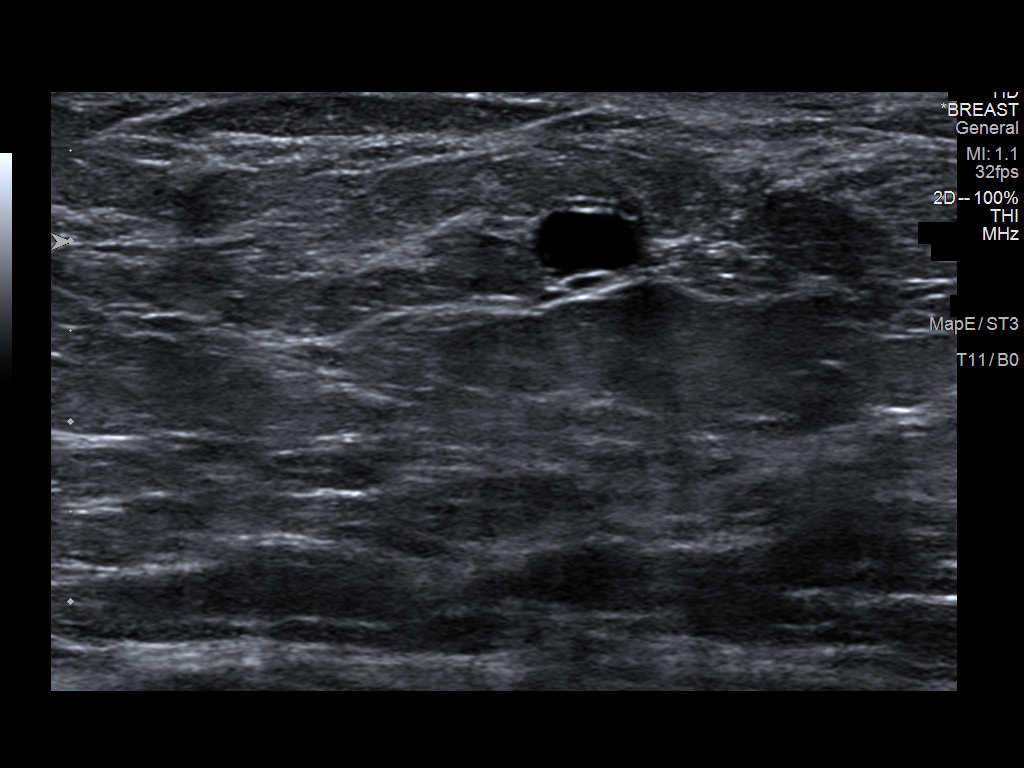
[im 3/7]
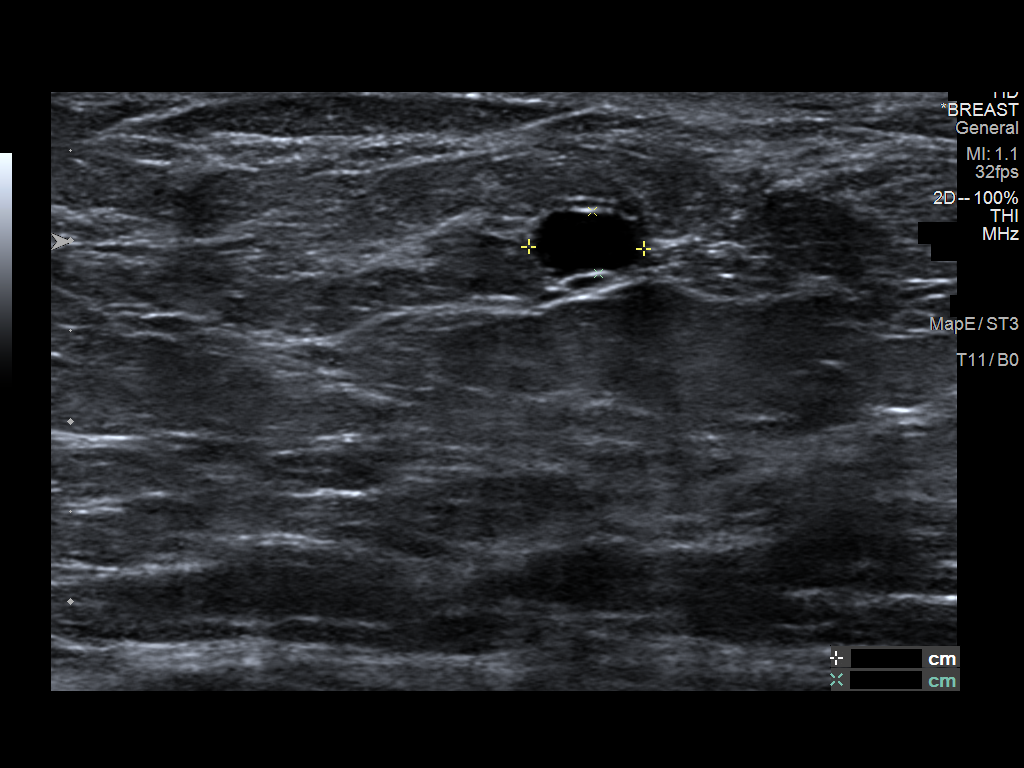
[im 4/7]
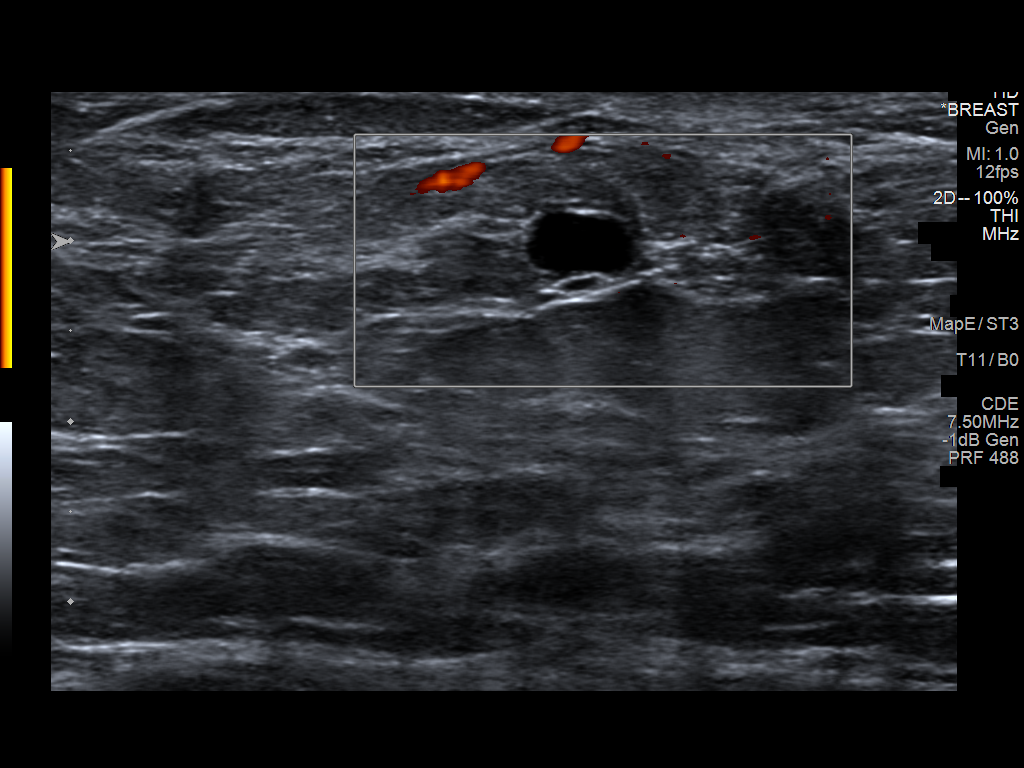
[im 5/7]
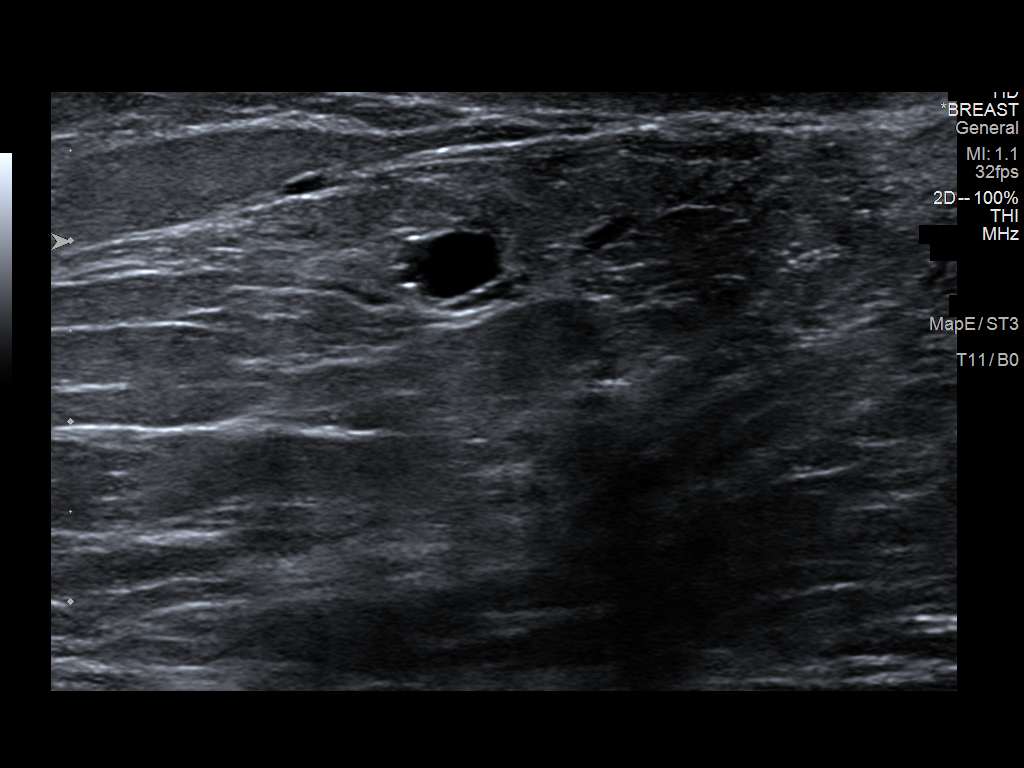
[im 6/7]
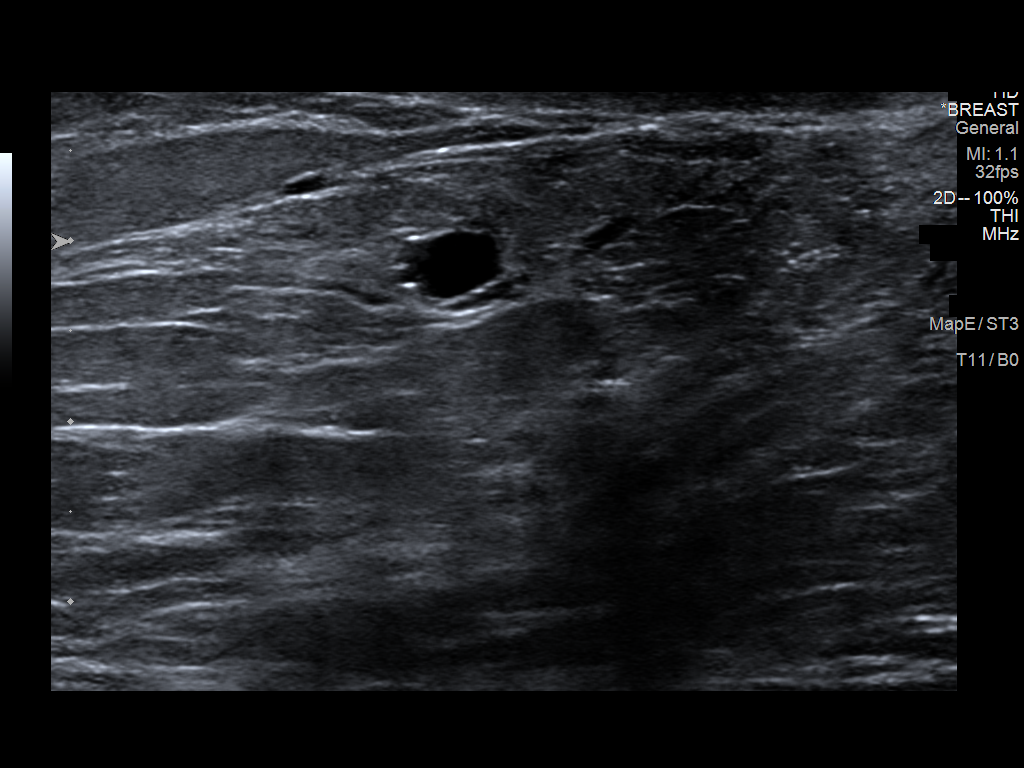
[im 7/7]
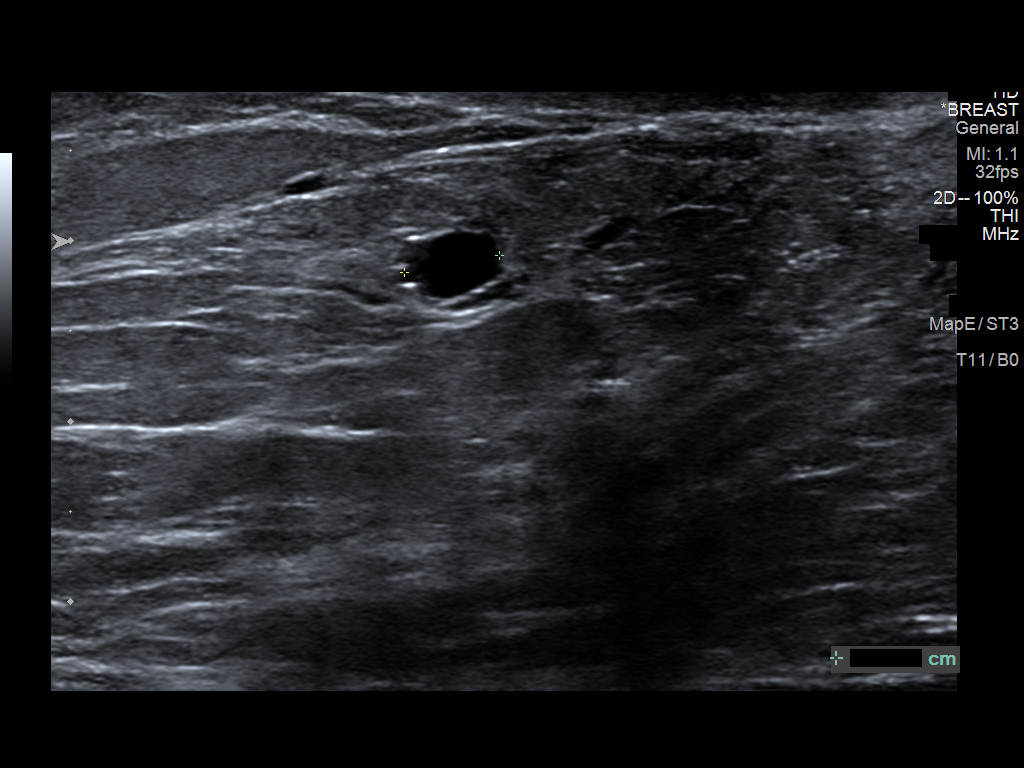

[7 of 7 positions shown; findings below may reference images not displayed]

ACR Breast Density Category b: There are scattered areas of
fibroglandular density.
FINDINGS: Additional imaging of the right breast was performed. There is
persistence of a 6 mm mass in the upper-outer quadrant of the
breast.

Targeted ultrasound is performed, showing a benign anechoic cyst in
the right breast at 10 o'clock 2 cm from the nipple measuring 6 x 3
x 5 mm.
IMPRESSION: Right breast cyst.  No evidence of malignancy in the right breast.

RECOMMENDATION:
Bilateral screening mammogram in Tuesday March, 2022 is recommended.

I have discussed the findings and recommendations with the patient.
If applicable, a reminder letter will be sent to the patient
regarding the next appointment.

BI-RADS CATEGORY  2: Benign.

## 2023-08-01 DIAGNOSIS — F411 Generalized anxiety disorder: Secondary | ICD-10-CM | POA: Diagnosis not present

## 2023-08-01 DIAGNOSIS — F331 Major depressive disorder, recurrent, moderate: Secondary | ICD-10-CM | POA: Diagnosis not present

## 2023-10-08 DIAGNOSIS — M797 Fibromyalgia: Secondary | ICD-10-CM | POA: Diagnosis not present

## 2023-10-08 DIAGNOSIS — Z79891 Long term (current) use of opiate analgesic: Secondary | ICD-10-CM | POA: Diagnosis not present

## 2023-10-08 DIAGNOSIS — M47816 Spondylosis without myelopathy or radiculopathy, lumbar region: Secondary | ICD-10-CM | POA: Diagnosis not present

## 2023-10-24 DIAGNOSIS — F331 Major depressive disorder, recurrent, moderate: Secondary | ICD-10-CM | POA: Diagnosis not present

## 2023-10-24 DIAGNOSIS — F411 Generalized anxiety disorder: Secondary | ICD-10-CM | POA: Diagnosis not present

## 2023-12-31 DIAGNOSIS — Z79891 Long term (current) use of opiate analgesic: Secondary | ICD-10-CM | POA: Diagnosis not present

## 2023-12-31 DIAGNOSIS — G894 Chronic pain syndrome: Secondary | ICD-10-CM | POA: Diagnosis not present

## 2023-12-31 DIAGNOSIS — Z79899 Other long term (current) drug therapy: Secondary | ICD-10-CM | POA: Diagnosis not present

## 2023-12-31 DIAGNOSIS — M797 Fibromyalgia: Secondary | ICD-10-CM | POA: Diagnosis not present

## 2023-12-31 DIAGNOSIS — Z5181 Encounter for therapeutic drug level monitoring: Secondary | ICD-10-CM | POA: Diagnosis not present

## 2023-12-31 DIAGNOSIS — M5136 Other intervertebral disc degeneration, lumbar region with discogenic back pain only: Secondary | ICD-10-CM | POA: Diagnosis not present

## 2024-01-09 DIAGNOSIS — F411 Generalized anxiety disorder: Secondary | ICD-10-CM | POA: Diagnosis not present

## 2024-01-09 DIAGNOSIS — F330 Major depressive disorder, recurrent, mild: Secondary | ICD-10-CM | POA: Diagnosis not present
